# Patient Record
Sex: Female | Born: 1940 | Race: White | Marital: Married | Smoking: Never smoker
Health system: Southern US, Community
[De-identification: ages and names within clinical notes are randomized; demographics above are authoritative.]

## PROBLEM LIST (undated history)

## (undated) DIAGNOSIS — D649 Anemia, unspecified: Secondary | ICD-10-CM

## (undated) DIAGNOSIS — F32A Depression, unspecified: Secondary | ICD-10-CM

## (undated) DIAGNOSIS — R7303 Prediabetes: Secondary | ICD-10-CM

## (undated) DIAGNOSIS — M1990 Unspecified osteoarthritis, unspecified site: Secondary | ICD-10-CM

## (undated) DIAGNOSIS — H3530 Unspecified macular degeneration: Secondary | ICD-10-CM

## (undated) DIAGNOSIS — Z87442 Personal history of urinary calculi: Secondary | ICD-10-CM

## (undated) DIAGNOSIS — I10 Essential (primary) hypertension: Secondary | ICD-10-CM

## (undated) DIAGNOSIS — E7800 Pure hypercholesterolemia, unspecified: Secondary | ICD-10-CM

## (undated) HISTORY — PX: COLONOSCOPY: SHX174

## (undated) HISTORY — PX: APPENDECTOMY: SHX54

## (undated) HISTORY — PX: CATARACT EXTRACTION: SUR2

## (undated) HISTORY — PX: KNEE ARTHROSCOPY: SHX127

## (undated) HISTORY — PX: TONSILLECTOMY: SUR1361

## (undated) HISTORY — PX: CHOLECYSTECTOMY: SHX55

## (undated) HISTORY — PX: WISDOM TOOTH EXTRACTION: SHX21

---

## 2011-05-29 DIAGNOSIS — M5030 Other cervical disc degeneration, unspecified cervical region: Secondary | ICD-10-CM | POA: Insufficient documentation

## 2011-05-29 DIAGNOSIS — M5412 Radiculopathy, cervical region: Secondary | ICD-10-CM | POA: Insufficient documentation

## 2012-11-24 DIAGNOSIS — M7918 Myalgia, other site: Secondary | ICD-10-CM | POA: Insufficient documentation

## 2013-08-26 ENCOUNTER — Other Ambulatory Visit: Payer: Self-pay | Admitting: Orthopaedic Surgery

## 2013-08-26 DIAGNOSIS — M545 Low back pain, unspecified: Secondary | ICD-10-CM

## 2013-09-03 ENCOUNTER — Ambulatory Visit
Admission: RE | Admit: 2013-09-03 | Discharge: 2013-09-03 | Disposition: A | Discharge status: Home / Self Care | Payer: Medicare Other | Source: Ambulatory Visit | Attending: Orthopaedic Surgery | Admitting: Orthopaedic Surgery

## 2013-09-03 DIAGNOSIS — M545 Low back pain, unspecified: Secondary | ICD-10-CM

## 2015-06-21 DIAGNOSIS — I10 Essential (primary) hypertension: Secondary | ICD-10-CM | POA: Insufficient documentation

## 2015-06-21 DIAGNOSIS — E782 Mixed hyperlipidemia: Secondary | ICD-10-CM | POA: Insufficient documentation

## 2015-06-21 DIAGNOSIS — E119 Type 2 diabetes mellitus without complications: Secondary | ICD-10-CM | POA: Insufficient documentation

## 2015-09-06 DIAGNOSIS — M654 Radial styloid tenosynovitis [de Quervain]: Secondary | ICD-10-CM | POA: Insufficient documentation

## 2015-09-06 DIAGNOSIS — M65331 Trigger finger, right middle finger: Secondary | ICD-10-CM | POA: Insufficient documentation

## 2017-05-01 ENCOUNTER — Ambulatory Visit (INDEPENDENT_AMBULATORY_CARE_PROVIDER_SITE_OTHER): Payer: Medicare Other

## 2017-05-01 ENCOUNTER — Ambulatory Visit (INDEPENDENT_AMBULATORY_CARE_PROVIDER_SITE_OTHER): Payer: Medicare Other | Admitting: Orthopaedic Surgery

## 2017-05-01 ENCOUNTER — Encounter (INDEPENDENT_AMBULATORY_CARE_PROVIDER_SITE_OTHER): Payer: Self-pay | Admitting: Orthopaedic Surgery

## 2017-05-01 DIAGNOSIS — M25562 Pain in left knee: Secondary | ICD-10-CM

## 2017-05-01 NOTE — Progress Notes (Signed)
Office Visit Note   Patient: Wendy Vaughn           Date of Birth: Dec 28, 1940           MRN: [id number] Visit Date: 05/01/2017              Requested by: No referring provider defined for this encounter. PCP: Leola BrazilEverly, Rebecca B, DO   Assessment & Plan: Visit Diagnoses:  1. Acute pain of left knee     Plan: Left knee was wrapped with an Ace bandage she will leave this on until this evening and take it off before going to bed.  If her pain does not dissipate in a week she will call the office and let us know so that we can order an MRI to rule out a meniscal tear.  Questions encouraged and answered by Dr. Magnus IvanBlackman and myself.  Follow-Up Instructions: Return if symptoms worsen or fail to improve.   Orders:  Orders Placed This Encounter  Procedures  . XR Knee 1-2 Views Left   No orders of the defined types were placed in this encounter.     Procedures: No procedures performed   Clinical Data: No additional findings.   Subjective: Chief Complaint  Patient presents with  . Left Knee - Pain    HPI Mrs.  Wendy Vaughn comes in today due to acute onset of left knee pain.  She reports that she was simply getting out of her car last night and twisted her leg.  Since then she has had trouble bearing weight on the knee and is gotten the point that she cannot bear weight on the knee cannot walk due to pain knee.  She has no real pain in the knee prior to last night.  She notes some swelling.  Knee feels as if it may give way. Review of Systems Negative outside HPI  Objective: Vital Signs: There were no vitals taken for this visit.  Physical Exam  Constitutional: She is oriented to person, place, and time. She appears well-developed and well-nourished. No distress.  Pulmonary/Chest: Effort normal.  Neurological: She is alert and oriented to person, place, and time.  Skin: She is not diaphoretic.  Psychiatric: She has a normal mood and affect.    Ortho Exam Left knee slight edema  no effusion.  No abnormal warmth no erythema.  No instability valgus varus stressing.  She has full extension flexion to approximately 105 degrees.  Tenderness along medial joint line.  Anterior drawer is negative.  McMurray's positive Specialty Comments:  No specialty comments available.  Imaging: Xr Knee 1-2 Views Left  Result Date: 05/01/2017 Left knee AP lateral views: No acute fracture.  Mild medial joint line narrowing.  Posterior medial joint  with loose body.  Moderate patellofemoral changes.  Lateral joint line is well-maintained.  No subluxation dislocation of the knee.  No bony abnormalities otherwise.    PMFS History: Patient Active Problem List   Diagnosis Date Noted  . De Quervain's tenosynovitis, left 09/06/2015  . Trigger middle finger of right hand 09/06/2015  . Benign essential HTN 06/21/2015  . Mixed hyperlipidemia 06/21/2015  . Type 2 diabetes mellitus without complication (HCC) 06/21/2015  . Myofascial pain 11/24/2012  . Cervical radiculopathy 05/29/2011  . DDD (degenerative disc disease), cervical 05/29/2011   History reviewed. No pertinent past medical history.  History reviewed. No pertinent family history.  History reviewed. No pertinent surgical history. Social History   Occupational History  . Not on file  Tobacco Use  .  Smoking status: Not on file  Substance and Sexual Activity  . Alcohol use: Not on file  . Drug use: Not on file  . Sexual activity: Not on file

## 2017-05-08 ENCOUNTER — Other Ambulatory Visit (INDEPENDENT_AMBULATORY_CARE_PROVIDER_SITE_OTHER): Payer: Self-pay | Admitting: *Deleted

## 2017-05-08 DIAGNOSIS — M25562 Pain in left knee: Principal | ICD-10-CM

## 2017-05-08 DIAGNOSIS — G8929 Other chronic pain: Secondary | ICD-10-CM

## 2017-05-26 ENCOUNTER — Ambulatory Visit
Admission: RE | Admit: 2017-05-26 | Discharge: 2017-05-26 | Disposition: A | Discharge status: Home / Self Care | Payer: Medicare Other | Source: Ambulatory Visit | Attending: Orthopaedic Surgery | Admitting: Orthopaedic Surgery

## 2017-05-26 DIAGNOSIS — M25562 Pain in left knee: Principal | ICD-10-CM

## 2017-05-26 DIAGNOSIS — G8929 Other chronic pain: Secondary | ICD-10-CM

## 2017-06-02 ENCOUNTER — Encounter (INDEPENDENT_AMBULATORY_CARE_PROVIDER_SITE_OTHER): Payer: Self-pay | Admitting: Orthopaedic Surgery

## 2017-06-02 ENCOUNTER — Ambulatory Visit (INDEPENDENT_AMBULATORY_CARE_PROVIDER_SITE_OTHER): Payer: Medicare Other | Admitting: Orthopaedic Surgery

## 2017-06-02 DIAGNOSIS — S83242D Other tear of medial meniscus, current injury, left knee, subsequent encounter: Secondary | ICD-10-CM | POA: Diagnosis not present

## 2017-06-02 NOTE — Progress Notes (Signed)
The patient is here to go over an MRI of her left knee.  She has had only mild aches and pains in that knee until an acute issue last month.  She had a twisting injury getting out of her car at church and had severe pain since then.  She still feels the knee is locking catching on her and she is uncertain of that knee in terms of being scared is not to support her and she is in a fall again.  We will send her for an MRI of the very conservative treatment including injection and quad strengthening exercises as well as rest, anti-inflammatories, and time.  On exam she is positive McMurray on the medial and lateral sides of her knee with mild effusion and good range of motion.  MRI does show complex tears of the posterior horn of the medial and lateral meniscus with only partial cartilage loss in the medial lateral compartments of the knee.  There is more extensive cartilage loss of the patella apex but the ACL PCL and collateral ligaments are all intact.  Given the extensive meniscal tearing in her knee were recommending an arthroscopic intervention.  This is also due to the continued mechanical symptoms of locking catching and the lack of trust she has in her knee.  I showed her knee model and went over in detail with the surgery involves including a thorough discussion of the risk and benefits of the surgery.  She does wish to have this scheduled soon and we will see her back in 1 week postoperative for suture removal.  We then work on strengthening her knee after that.  All questions concerns were answered and addressed.

## 2017-06-12 ENCOUNTER — Encounter: Payer: Self-pay | Admitting: Orthopaedic Surgery

## 2017-06-12 DIAGNOSIS — M23252 Derangement of posterior horn of lateral meniscus due to old tear or injury, left knee: Secondary | ICD-10-CM | POA: Diagnosis not present

## 2017-06-23 ENCOUNTER — Encounter (INDEPENDENT_AMBULATORY_CARE_PROVIDER_SITE_OTHER): Payer: Self-pay | Admitting: Orthopaedic Surgery

## 2017-06-23 ENCOUNTER — Ambulatory Visit (INDEPENDENT_AMBULATORY_CARE_PROVIDER_SITE_OTHER): Payer: Medicare Other | Admitting: Orthopaedic Surgery

## 2017-06-23 DIAGNOSIS — Z9889 Other specified postprocedural states: Secondary | ICD-10-CM

## 2017-06-23 DIAGNOSIS — M1712 Unilateral primary osteoarthritis, left knee: Secondary | ICD-10-CM

## 2017-06-23 MED ORDER — LIDOCAINE HCL 1 % IJ SOLN
3.0000 mL | INTRAMUSCULAR | Status: AC | PRN
  Administered 2017-06-23: 3 mL

## 2017-06-23 MED ORDER — METHYLPREDNISOLONE ACETATE 40 MG/ML IJ SUSP
40.0000 mg | INTRAMUSCULAR | Status: AC | PRN
  Administered 2017-06-23: 40 mg via INTRA_ARTICULAR

## 2017-06-23 NOTE — Progress Notes (Signed)
Office Visit Note   Patient: Wendy Vaughn           Date of Birth: [birth year]           MRN: [id number] Visit Date: 06/23/2017              Requested by: Leola BrazilEverly, Rebecca B, DO [address] [place], [place] [id number] PCP: Leola BrazilEverly, Rebecca B, DO   Assessment & Plan: Visit Diagnoses:  1. Status post arthroscopy of left knee     Plan: The patient is a perfect candidate for hyaluronic acid in her left knee given the moderate arthritic findings.  I placed a steroid injection in her knee today and we will see her back in a month to place hyaluronic acid injection into her left knee.  At that same visit though I would like to provide trigger point injections around her right scapula and trapezius area due to chronic trigger point pain.  All questions and concerns were answered and addressed.  Follow-Up Instructions: Return in about 4 weeks (around 07/21/2017).   Orders:  No orders of the defined types were placed in this encounter.  No orders of the defined types were placed in this encounter.     Procedures: Large Joint Inj: L knee on 06/23/2017 4:01 PM Indications: diagnostic evaluation and pain Details: 22 G 1.5 in needle, superolateral approach  Arthrogram: No  Medications: 3 mL lidocaine 1 %; 40 mg methylPREDNISolone acetate 40 MG/ML Outcome: tolerated well, no immediate complications Procedure, treatment alternatives, risks and benefits explained, specific risks discussed. Consent was given by the patient. Immediately prior to procedure a time out was called to verify the correct patient, procedure, equipment, support staff and site/side marked as required. Patient was prepped and draped in the usual sterile fashion.       Clinical Data: No additional findings.   Subjective: Chief Complaint  Patient presents with  . Left Knee - Routine Post Op  The patient is well-known to me.  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  This is moderate arthritis.  She would like a steroid injection in the knee today and for me to try to get fluid off of her knee today.  We talked about hyaluronic acid in the future.  She does have a history of severe trigger point type pain along the right trapezius and scapular body.  This been a chronic problem for her especially with laying down.  She denies any headache, chest pain, shortness of breath, fever, chills, nausea, vomiting.  HPI  Review of Systems See above  Objective: Vital Signs: There were no vitals taken for this visit.  Physical Exam She is alert and oriented x3 no acute distress Ortho Exam Her left knee is examined postoperatively and shows what I feel is a mild effusion but I cannot do any fluid off of her knee.  Her incisions are well-healed with sutures removed.  She has good range of motion of that knee and pain along the medial joint line to be expected given the extent of her surgery.  She does have severe trigger point pain around the right trapezius muscle that extends around the right scapular winging.  There is no scapular winging though and no muscle atrophy or weakness.  This  appears to be trigger point pain Specialty Comments:  No specialty comments available.  Imaging: No results found. We were able to go over arthroscopy pictures as well.  PMFS History: Patient Active Problem List   Diagnosis Date Noted  . Status post arthroscopy of left knee 06/23/2017  . Other tear of medial meniscus, current injury, left knee, subsequent encounter 06/02/2017  . De Quervain's tenosynovitis, left 09/06/2015  . Trigger middle finger of right hand 09/06/2015  . Benign essential HTN 06/21/2015  . Mixed hyperlipidemia 06/21/2015  . Type 2 diabetes mellitus without complication (HCC)  06/21/2015  . Myofascial pain 11/24/2012  . Cervical radiculopathy 05/29/2011  . DDD (degenerative disc disease), cervical 05/29/2011   History reviewed. No pertinent past medical history.  History reviewed. No pertinent family history.  History reviewed. No pertinent surgical history. Social History   Occupational History  . Not on file  Tobacco Use  . Smoking status: Not on file  Substance and Sexual Activity  . Alcohol use: Not on file  . Drug use: Not on file  . Sexual activity: Not on file

## 2017-06-24 ENCOUNTER — Telehealth (INDEPENDENT_AMBULATORY_CARE_PROVIDER_SITE_OTHER): Payer: Self-pay

## 2017-06-24 NOTE — Telephone Encounter (Signed)
Submitted benefit verification for Monovisc Injection online.

## 2017-06-25 ENCOUNTER — Telehealth (INDEPENDENT_AMBULATORY_CARE_PROVIDER_SITE_OTHER): Payer: Self-pay

## 2017-06-25 NOTE — Telephone Encounter (Signed)
Talked with patient and advised her that she is covered for left knee injection for Monovisc and we can Buy & Bill for Injection.  Patient's secondary, [employer] supplement will cover patient's 20%.  Appt.scheduled for 07/21/2017.

## 2017-07-21 ENCOUNTER — Ambulatory Visit (INDEPENDENT_AMBULATORY_CARE_PROVIDER_SITE_OTHER): Payer: Medicare Other | Admitting: Orthopaedic Surgery

## 2017-07-21 ENCOUNTER — Encounter (INDEPENDENT_AMBULATORY_CARE_PROVIDER_SITE_OTHER): Payer: Self-pay | Admitting: Orthopaedic Surgery

## 2017-07-21 DIAGNOSIS — Z9889 Other specified postprocedural states: Secondary | ICD-10-CM

## 2017-07-21 DIAGNOSIS — G8929 Other chronic pain: Secondary | ICD-10-CM

## 2017-07-21 DIAGNOSIS — M1712 Unilateral primary osteoarthritis, left knee: Secondary | ICD-10-CM | POA: Diagnosis not present

## 2017-07-21 DIAGNOSIS — M25562 Pain in left knee: Secondary | ICD-10-CM

## 2017-07-21 MED ORDER — HYALURONAN 88 MG/4ML IX SOSY
88.0000 mg | PREFILLED_SYRINGE | INTRA_ARTICULAR | Status: AC | PRN
  Administered 2017-07-21: 88 mg via INTRA_ARTICULAR

## 2017-07-21 NOTE — Progress Notes (Signed)
   Procedure Note  Patient: Wendy Vaughn             Date of Birth: [birth year]           MRN: [id number]             Visit Date: 07/21/2017  Procedures: Visit Diagnoses: Status post arthroscopy of left knee  Chronic pain of left knee  Unilateral primary osteoarthritis, left knee  Large Joint Inj: L knee on 07/21/2017 1:46 PM Indications: pain and diagnostic evaluation Details: 22 G 1.5 in needle, superolateral approach  Arthrogram: No  Medications: 88 mg Hyaluronan 88 MG/4ML Outcome: tolerated well, no immediate complications Procedure, treatment alternatives, risks and benefits explained, specific risks discussed. Consent was given by the patient. Immediately prior to procedure a time out was called to verify the correct patient, procedure, equipment, support staff and site/side marked as required. Patient was prepped and draped in the usual sterile fashion.     The patient is here today for scheduled hyaluronic acid injection in her left knee using a Monovisc to treat moderate osteoarthritis and the pain associated with this.  She is doing well overall from her knee arthroscopy we did find some areas of moderate arthritis and it is believed that this is the best option for her now with trying hyaluronic acid.  The risks and benefits of these type injections were explained to her in detail and she said a handout that she read about it and would like to proceed with injection today.  On exam there is no significant swelling in her knee.  She tolerated the injection well.  All questions concerns were answered and addressed.  She will follow-up as needed.

## 2018-03-29 IMAGING — MR MR KNEE*L* W/O CM
6 series · 34 of 40 positions shown · non-contrast
Comparison: None.

CLINICAL DATA: Chronic left knee pain.

EXAM:
MRI OF THE LEFT KNEE WITHOUT CONTRAST
TECHNIQUE: Multiplanar, multisequence MR imaging of the knee was performed. No
intravenous contrast was administered.

[Series 6: PD fat-sat · axial · left · 3.5mm · 0.39mm/px · z∈[-65,+48]mm · 8 of 28 slices shown (1 of 3)]
[im 1/28]
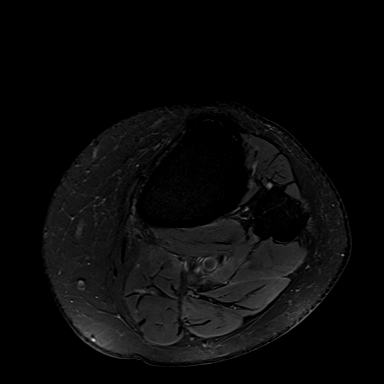
[im 4/28]
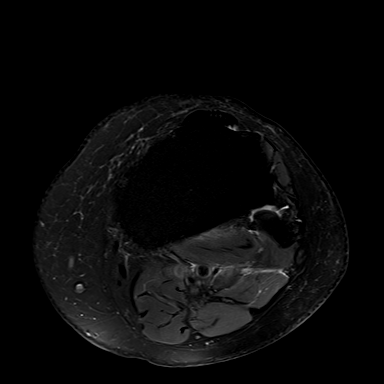
[im 8/28]
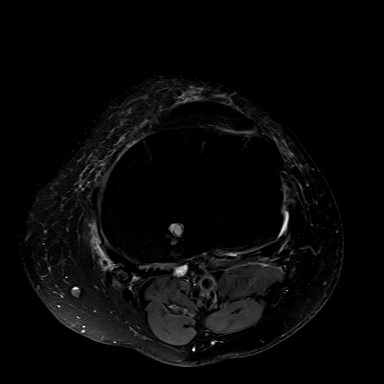
[im 12/28]
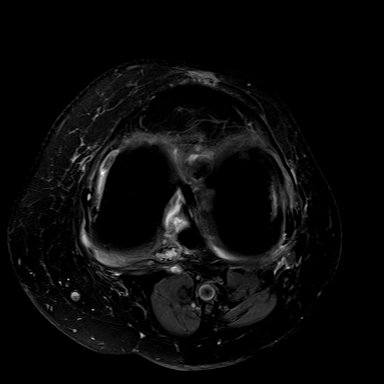
[im 16/28]
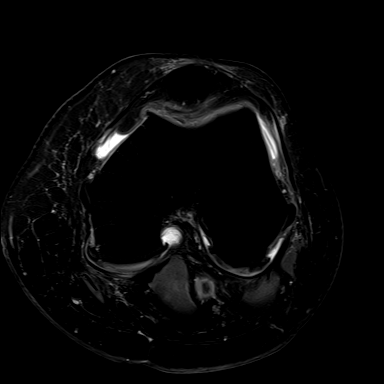
[im 20/28]
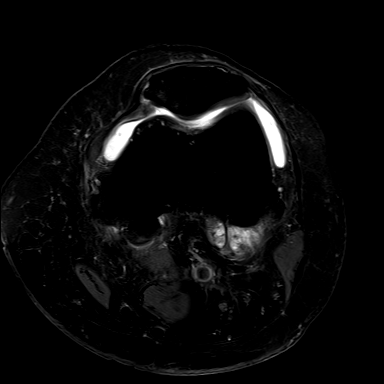
[im 24/28]
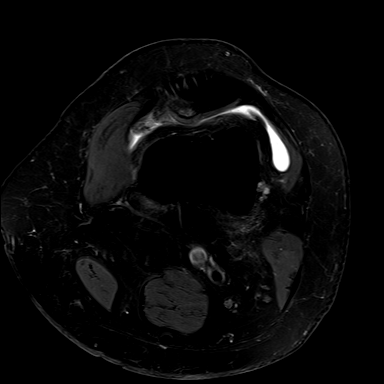
[im 28/28]
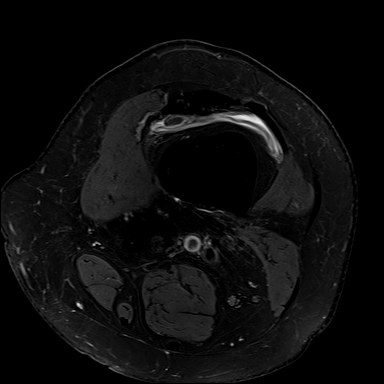

[Series 7: PD fat-sat · coronal · left · 3.5mm · 0.39mm/px · 7 of 24 slices shown (2 of 3)]
[im 1/24]
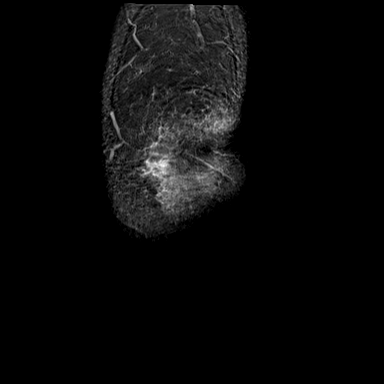
[im 4/24]
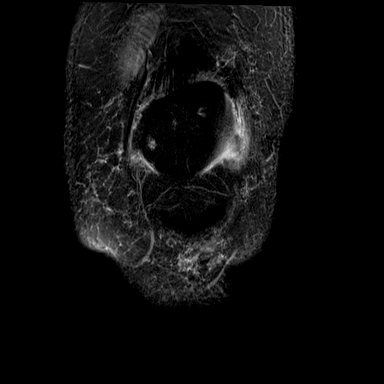
[im 8/24]
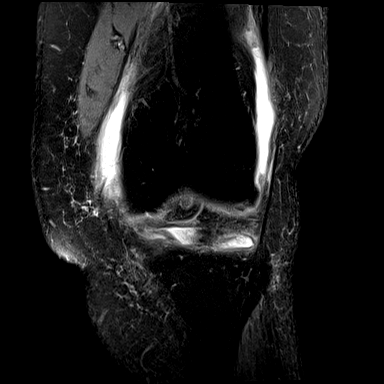
[im 12/24]
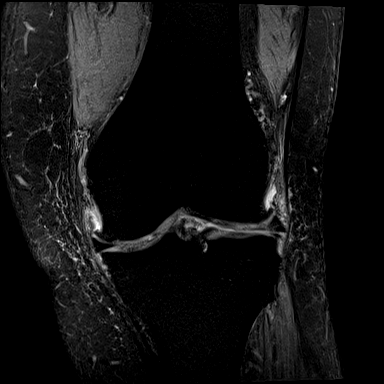
[im 16/24]
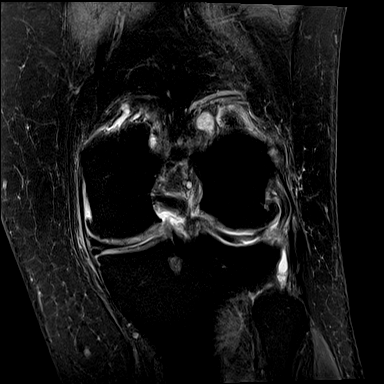
[im 20/24]
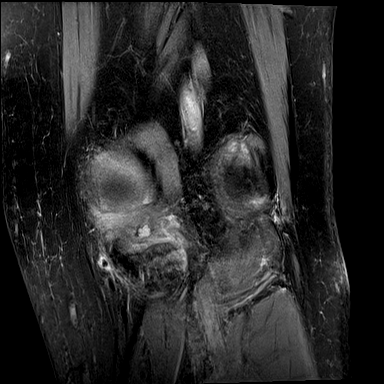
[im 24/24]
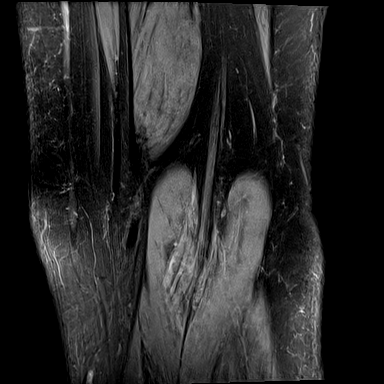

[Series 8: t1_tse_cor · coronal · left · 3.5mm · 0.39mm/px · 1 of 24 slices shown]
[im 1/24]
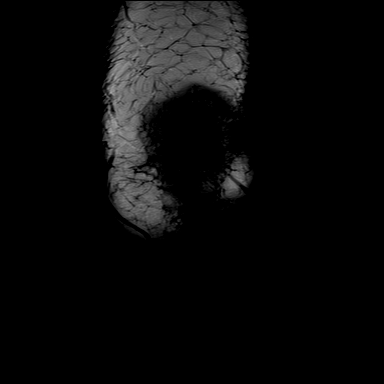

[Series 9: PD fat-sat · sagittal · left · 3.3mm · 0.39mm/px · 8 of 26 slices shown (3 of 3)]
[im 1/26]
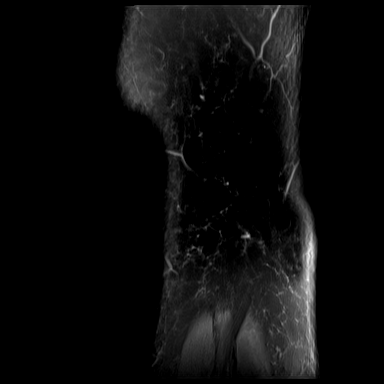
[im 4/26]
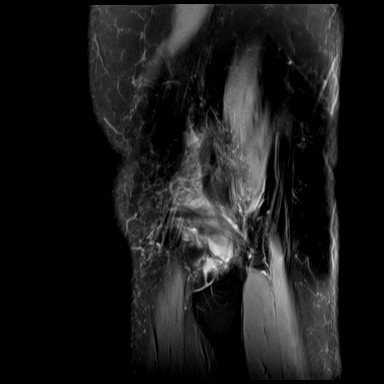
[im 8/26]
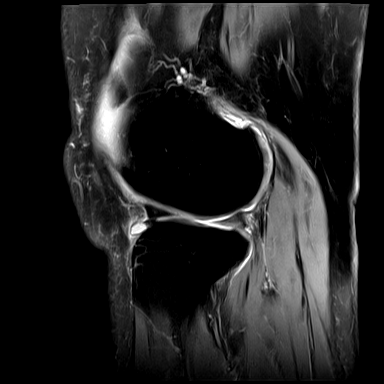
[im 11/26]
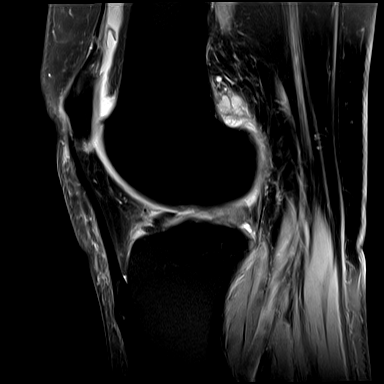
[im 15/26]
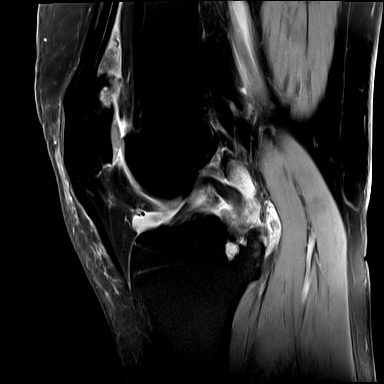
[im 18/26]
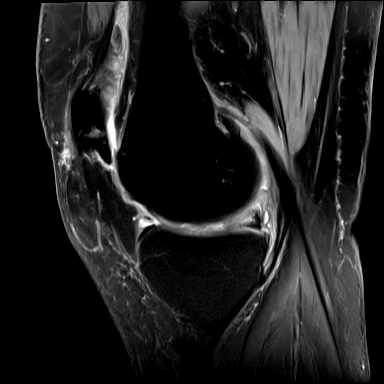
[im 22/26]
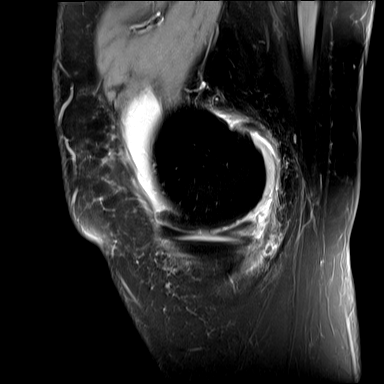
[im 26/26]
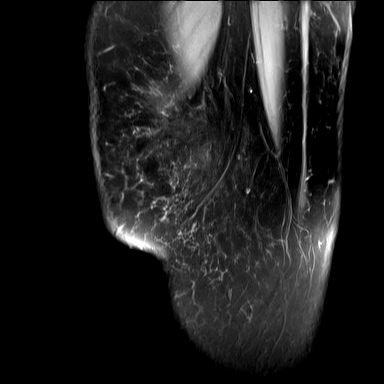

[Series 10: T2 fat-sat · coronal · left · 3.5mm · 0.39mm/px · 7 of 24 slices shown]
[im 1/24]
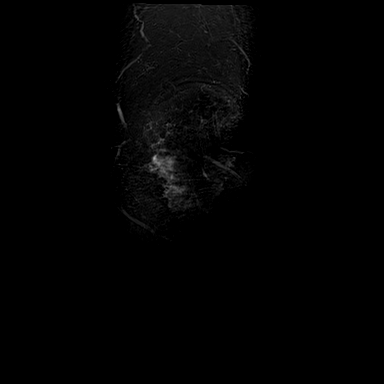
[im 4/24]
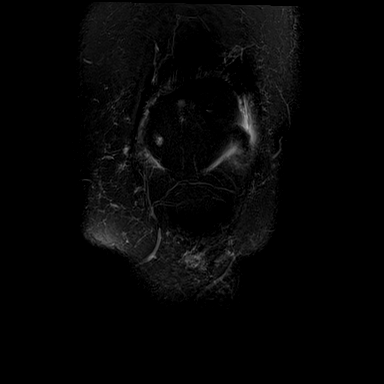
[im 8/24]
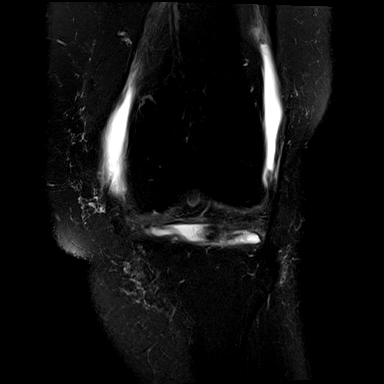
[im 12/24]
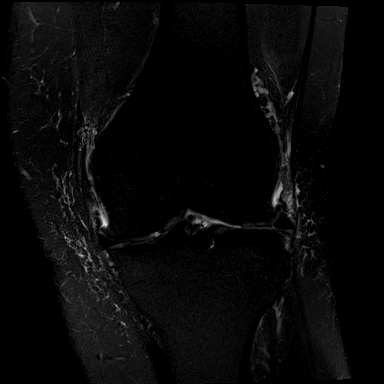
[im 16/24]
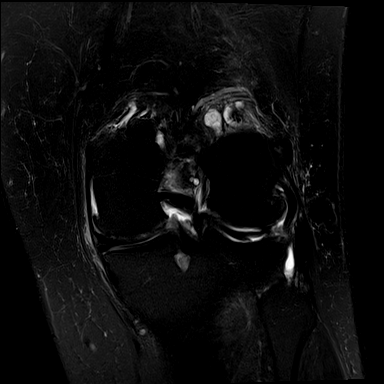
[im 20/24]
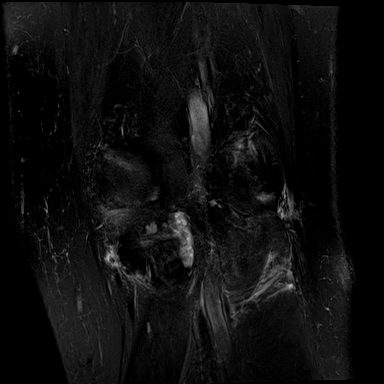
[im 24/24]
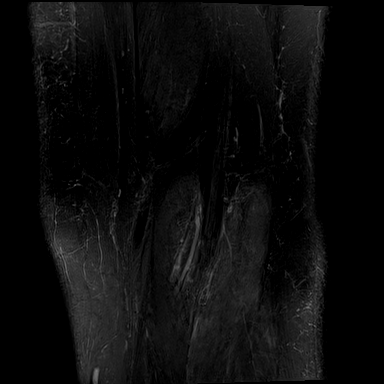

[Series 11: PD · coronal · left · 2.5mm · 0.44mm/px · 3 of 12 slices shown]
[im 1/12]
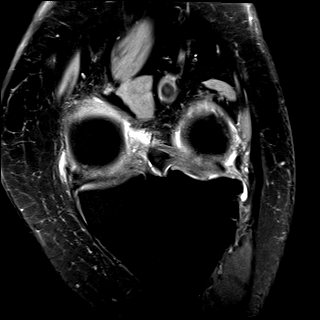
[im 6/12]
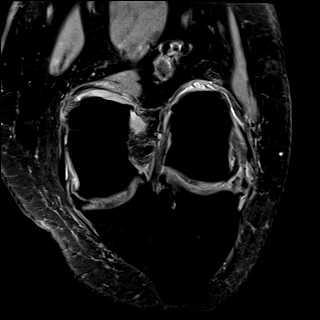
[im 12/12]
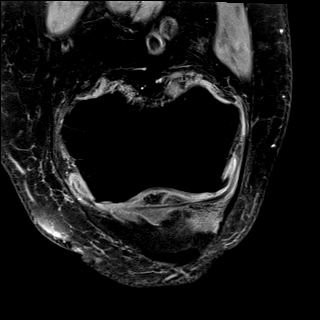

[34 of 40 positions shown; findings below may reference images not displayed]

FINDINGS: MENISCI

Medial meniscus: There is an extensive horizontal tear of the
posterior horn with extension to the periphery with parameniscal
cyst formation at the posteromedial corner.

Lateral meniscus: There is severe degeneration of posterior horn
with a complex tear at the posterolateral corner.

LIGAMENTS

Cruciates:  Intact.

Collaterals:  Normal.

CARTILAGE

Patellofemoral: Focal areas of full and partial thickness cartilage
loss of the apex and medial facet of the patella.

Medial: Multiple areas of partial-thickness cartilage loss on the
femoral condyle with some undermining of the articular cartilage in
the central and posterior aspects of the femoral condyle.

Lateral: Partial-thickness cartilage loss of the posterior aspects
of the femoral condyle and tibial plateau.

Joint: Small joint effusion. Synovial hypertrophy. Ossified
irregular loose body in Hoffa's fat pad in the midline. 12 mm loose
body at the posteromedial corner adjacent to the semimembranosus
tendon.

Popliteal Fossa:  No Baker's cyst.  Popliteus tendon is intact.

Extensor Mechanism:  Normal.

Bones:  Tricompartmental marginal osteophytes.

Other: None
IMPRESSION: 1. Extensive horizontal tear of the posterior horn of the medial
meniscus with parameniscal cysts.
2. Complex tear of the posterior horn of the lateral meniscus.
3. Small joint effusion with synovial hypertrophy and loose bodies
in the joint.
4. Tricompartmental cartilage defects as described.

## 2019-03-22 ENCOUNTER — Encounter: Payer: Self-pay | Admitting: Orthopaedic Surgery

## 2019-03-22 ENCOUNTER — Ambulatory Visit (INDEPENDENT_AMBULATORY_CARE_PROVIDER_SITE_OTHER): Payer: Medicare Other

## 2019-03-22 ENCOUNTER — Other Ambulatory Visit: Payer: Self-pay

## 2019-03-22 ENCOUNTER — Ambulatory Visit (INDEPENDENT_AMBULATORY_CARE_PROVIDER_SITE_OTHER): Payer: Medicare Other | Admitting: Orthopaedic Surgery

## 2019-03-22 DIAGNOSIS — M25562 Pain in left knee: Secondary | ICD-10-CM

## 2019-03-22 DIAGNOSIS — M1712 Unilateral primary osteoarthritis, left knee: Secondary | ICD-10-CM | POA: Diagnosis not present

## 2019-03-22 DIAGNOSIS — G8929 Other chronic pain: Secondary | ICD-10-CM | POA: Diagnosis not present

## 2019-03-22 MED ORDER — LIDOCAINE HCL 1 % IJ SOLN
3.0000 mL | INTRAMUSCULAR | Status: AC | PRN
  Administered 2019-03-22: 3 mL

## 2019-03-22 MED ORDER — METHYLPREDNISOLONE ACETATE 40 MG/ML IJ SUSP
40.0000 mg | INTRAMUSCULAR | Status: AC | PRN
  Administered 2019-03-22: 17:00:00 40 mg via INTRA_ARTICULAR

## 2019-03-22 NOTE — Progress Notes (Signed)
Office Visit Note   Patient: Wendy Vaughn           Date of Birth: 04/07/1941           MRN: [id number] Visit Date: 03/22/2019              Requested by: Leola Brazil, DO [address] [place],  [place] [address] PCP: Leola Brazil, DO   Assessment & Plan: Visit Diagnoses:  1. Acute pain of left knee   2. Chronic pain of left knee   3. Unilateral primary osteoarthritis, left knee     Plan: I was able to aspirate about 20 cc of serous fluid off of the knee it looks more consistent with arthritis.  I placed a steroid injection in her knee and gave her relief of her pain.  She is ambulating with a slight limp.  She knows to try cane in her opposite hand.  All question concerns were answered addressed.  Follow-up will be as needed.  Follow-Up Instructions: Return if symptoms worsen or fail to improve.   Orders:  Orders Placed This Encounter  Procedures  . Large Joint Inj  . XR KNEE 3 VIEW LEFT   No orders of the defined types were placed in this encounter.     Procedures: Large Joint Inj: L knee on 03/22/2019 5:21 PM Indications: diagnostic evaluation and pain Details: 22 G 1.5 in needle, superolateral approach  Arthrogram: No  Medications: 3 mL lidocaine 1 %; 40 mg methylPREDNISolone acetate 40 MG/ML Outcome: tolerated well, no immediate complications Procedure, treatment alternatives, risks and benefits explained, specific risks discussed. Consent was given by the patient. Immediately prior to procedure a time out was called to verify the correct patient, procedure, equipment, support staff and site/side marked as required. Patient was prepped and draped in the usual sterile fashion.       Clinical Data: No additional findings.   Subjective: Chief Complaint  Patient presents with  . Left Knee - Pain  The patient comes in today with acute on chronic left knee pain.  She has known and well-documented osteoarthritis of her left knee.  She has  had steroid injections and hyaluronic acid in the past.  She had a recent mechanical fall and she feels like the left knee is really painful to her.  She feels like she may have "ripped something".  She has had swelling and pain since then.  She denies any other acute changes in her medical status.  HPI  Review of Systems She currently denies any headache, chest pain, shortness of breath, fever, chills, nausea, vomiting  Objective: Vital Signs: There were no vitals taken for this visit.  Physical Exam She is alert and orient x3 and in no acute distress Ortho Exam Examination of her left knee shows a mild effusion.  She has a painful arc of motion of the left knee but is ligamentously stable.  There is medial lateral joint line tenderness and a slight flexion contracture. Specialty Comments:  No specialty comments available.  Imaging: Xr Knee 3 View Left  Result Date: 03/22/2019 2 views of the left knee show no acute findings.  There is moderate arthritis in the knee with joint space narrowing and periarticular osteophytes.  There is no malalignment.    PMFS History: Patient Active Problem List   Diagnosis Date Noted  . Unilateral primary osteoarthritis, left knee 07/21/2017  . Chronic pain of left knee 07/21/2017  . Status post arthroscopy of  left knee 06/23/2017  . Other tear of medial meniscus, current injury, left knee, subsequent encounter 06/02/2017  . De Quervain's tenosynovitis, left 09/06/2015  . Trigger middle finger of right hand 09/06/2015  . Benign essential HTN 06/21/2015  . Mixed hyperlipidemia 06/21/2015  . Type 2 diabetes mellitus without complication ([facility]) 25/42/7062  . Myofascial pain 11/24/2012  . Cervical radiculopathy 05/29/2011  . DDD (degenerative disc disease), cervical 05/29/2011   History reviewed. No pertinent past medical history.  History reviewed. No pertinent family history.  History reviewed. No pertinent surgical history. Social History    Occupational History  . Not on file  Tobacco Use  . Smoking status: Not on file  Substance and Sexual Activity  . Alcohol use: Not on file  . Drug use: Not on file  . Sexual activity: Not on file

## 2019-03-24 ENCOUNTER — Ambulatory Visit: Payer: Medicare Other | Admitting: Orthopaedic Surgery

## 2019-07-08 ENCOUNTER — Ambulatory Visit (INDEPENDENT_AMBULATORY_CARE_PROVIDER_SITE_OTHER): Payer: Medicare Other

## 2019-07-08 ENCOUNTER — Other Ambulatory Visit: Payer: Self-pay

## 2019-07-08 ENCOUNTER — Encounter: Payer: Self-pay | Admitting: Orthopaedic Surgery

## 2019-07-08 ENCOUNTER — Ambulatory Visit (INDEPENDENT_AMBULATORY_CARE_PROVIDER_SITE_OTHER): Payer: Medicare Other | Admitting: Orthopaedic Surgery

## 2019-07-08 DIAGNOSIS — M549 Dorsalgia, unspecified: Secondary | ICD-10-CM | POA: Diagnosis not present

## 2019-07-08 NOTE — Progress Notes (Signed)
Office Visit Note   Patient: Wendy Vaughn           Date of Birth: [birth year]           MRN: [id number] Visit Date: 07/08/2019              Requested by: Leola Brazil, DO [address] [place],  [place] [address] PCP: Leola Brazil, DO   Assessment & Plan: Visit Diagnoses:  1. Mid back pain     Plan: I would like to send her to formal outpatient physical therapy to have outpatient therapy work on knee modalities to calm down her parascapular and thoracic pain.  I do feel that this is something that they can try modalities such as e-stim and diaphoresis and even consider dry needling.  She agrees with this treatment plan.  Also have recommended that she try Voltaren gel in this area 2-4 times a day.  I gave her information about getting this as an outpatient.  All question concerns were answered and addressed.  We will work on setting up outpatient therapy.  I would like to see her back in about 4 weeks to see how she is doing overall.  No x-rays are needed.  I may even consider a trigger point injection then if needed.  Follow-Up Instructions: Return in about 4 weeks (around 08/05/2019).   Orders:  Orders Placed This Encounter  Procedures  . XR Thoracic Spine 2 View   No orders of the defined types were placed in this encounter.     Procedures: No procedures performed   Clinical Data: No additional findings.   Subjective: Chief Complaint  Patient presents with  . Middle Back - Pain  Wendy Vaughn has been dealing with thoracic spine pain around the middle of her back into the left side for about 30 years now.  She is very active.  Usually she would lay down and it would go away.  She does a lot of yard work as well.  She is very active 78.  It is now gone where the pain is becoming debilitating for her.  She denies any weakness in her upper or lower extremities and it is the same pain that she has had before.  She does go to massage therapy and that has not  helped.  She is not had any formal physical therapy.  She denies any other acute change in her medical status.  She is not a smoker  HPI  Review of Systems .  She currently denies any headache, chest pain, shortness of breath, fever, chills, nausea, vomiting  Objective: Vital Signs: There were no vitals taken for this visit.  Physical Exam She is alert and orient x3 and in no acute distress Ortho Exam Examination of her thoracic spine shows good flexion extension of the spine.  She does have paraspinal and parascapular pain to the left side at the mid thoracic spine area.  The remainder of her exam is normal. Specialty Comments:  No specialty comments available.  Imaging: XR Thoracic Spine 2 View  Result Date: 07/08/2019 2 views of the thoracic spine showed no acute findings.  There is degenerative changes every level but no compression deformities.    PMFS History: Patient Active Problem List   Diagnosis Date Noted  . Unilateral primary osteoarthritis, left knee 07/21/2017  . Chronic pain of left knee 07/21/2017  . Status post arthroscopy of left knee 06/23/2017  . Other tear of medial meniscus, current  injury, left knee, subsequent encounter 06/02/2017  . De Quervain's tenosynovitis, left 09/06/2015  . Trigger middle finger of right hand 09/06/2015  . Benign essential HTN 06/21/2015  . Mixed hyperlipidemia 06/21/2015  . Type 2 diabetes mellitus without complication ([facility]) 09/38/1829  . Myofascial pain 11/24/2012  . Cervical radiculopathy 05/29/2011  . DDD (degenerative disc disease), cervical 05/29/2011   History reviewed. No pertinent past medical history.  History reviewed. No pertinent family history.  History reviewed. No pertinent surgical history. Social History   Occupational History  . Not on file  Tobacco Use  . Smoking status: Not on file  Substance and Sexual Activity  . Alcohol use: Not on file  . Drug use: Not on file  . Sexual activity: Not on file

## 2019-07-13 ENCOUNTER — Other Ambulatory Visit: Payer: Self-pay

## 2019-07-13 ENCOUNTER — Encounter: Payer: Self-pay | Admitting: Physical Therapy

## 2019-07-13 ENCOUNTER — Ambulatory Visit: Payer: Medicare Other | Attending: Orthopaedic Surgery | Admitting: Physical Therapy

## 2019-07-13 DIAGNOSIS — M62838 Other muscle spasm: Secondary | ICD-10-CM | POA: Diagnosis present

## 2019-07-13 DIAGNOSIS — M546 Pain in thoracic spine: Secondary | ICD-10-CM

## 2019-07-13 DIAGNOSIS — R293 Abnormal posture: Secondary | ICD-10-CM | POA: Diagnosis present

## 2019-07-13 NOTE — Therapy (Signed)
[facility] Outpatient Rehabilitation [facility] [address]  [address] [place], [place], [address] Phone: [phone]   Fax:  [phone]  Physical Therapy Evaluation  Patient Details  Name: Wendy Vaughn MRN: [id number] Date of Birth: Mar 23, 1941 Referring Provider (PT): Doneen Poisson, MD   Encounter Date: 07/13/2019  PT End of Session - 07/13/19 1525    Visit Number  1    Number of Visits  7    Date for PT Re-Evaluation  08/24/19    Authorization Type  Medicare & AARP    PT Start Time  1442    PT Stop Time  1517    PT Time Calculation (min)  35 min    Activity Tolerance  Patient tolerated treatment well    Behavior During Therapy  [facility] for tasks assessed/performed       History reviewed. No pertinent past medical history.  History reviewed. No pertinent surgical history.  There were no vitals filed for this visit.   Subjective Assessment - 07/13/19 1446    Subjective  Patient reporting L midback pain of about 30 years duration. Was made worse by working in the kitchen, ironing, or working in the yard and was relieved by sitting or laying down to rest. Previously this pain would go away within a couple hours, but recently it is taking up to 2 days to gradually go away. Pain is located over L side of torso. Denies N/T.    Pertinent History  HTN, cervical radiculopathy, cervical DDD, L de quervain's, HLD, myofascial pain, DM II, L knee arthroscopythoracic FOTO!!L thoracic pain better with laying down modalities2/25/2021 thoracic xray: no acute findings.  There is degenerative changes every level but no compression deformities.    Limitations  Lifting;Standing;Walking;Writing;House hold activities    Diagnostic tests  07/08/2019 thoracic xray: no acute findings.  There is degenerative changes every level but no compression deformities.    Patient Stated Goals  "to work in the yard without pain"    Currently in Pain?  Yes    Pain Score  0-No pain    Pain  Location  Thoracic    Pain Orientation  Left;Mid    Pain Descriptors / Indicators  Throbbing    Pain Type  Chronic pain         [facility] PT Assessment - 07/13/19 1451      Assessment   Medical Diagnosis  Midback pain    Referring Provider (PT)  Doneen Poisson, MD    Onset Date/Surgical Date  --   30 years   Hand Dominance  Right    Next MD Visit  08/05/19    Prior Therapy  no      Precautions   Precautions  None      Balance Screen   Has the patient fallen in the past 6 months  Yes    How many times?  1   slide down a hill and hurt her L knee   Has the patient had a decrease in activity level because of a fear of falling?   No    Is the patient reluctant to leave their home because of a fear of falling?   No      Home Environment   Living Environment  Private residence    Living Arrangements  Alone    Available Help at Discharge  Family    Type of Home  House      Prior Function   Level of Independence  Independent  Vocation  Retired    Leisure  working in the yard      Cognition   Overall Cognitive Status  Within [employer] for tasks assessed      Sensation   Light Touch  Appears Intact      Coordination   Gross Motor Movements are Fluid and Coordinated  Yes      Posture/Postural Control   Posture/Postural Control  Postural limitations    Postural Limitations  Rounded Shoulders;Forward head;Increased thoracic kyphosis      ROM / Strength   AROM / PROM / Strength  AROM;Strength      AROM   Overall AROM Comments  B shoulder AROM WFL and nonpainful    AROM Assessment Site  Thoracic    Thoracic Flexion  WFL    Thoracic Extension  moderately limited   c/o stiffness   Thoracic - Right Side Bend  mildly limited    Thoracic - Left Side Bend  mildly limited    Thoracic - Right Rotation  mildly limited    Thoracic - Left Rotation  mildly limited      Strength   Strength Assessment Site  Shoulder    Right/Left Shoulder  Right;Left    Right  Shoulder Flexion  4+/5    Right Shoulder ABduction  4+/5    Right Shoulder Internal Rotation  4+/5    Right Shoulder External Rotation  4/5    Left Shoulder Flexion  4+/5    Left Shoulder ABduction  4+/5    Left Shoulder Internal Rotation  4+/5    Left Shoulder External Rotation  4/5      Palpation   Spinal mobility  TTP with gentle PAs over upper thoracic spine    Palpation comment  TTP over L proximal lats near axilla, thoracic paraspinals, rhomboids      Ambulation/Gait   Assistive device  None    Gait Pattern  Step-through pattern;Step-to pattern;Decreased step length - right;Decreased step length - left;Trunk flexed;Poor foot clearance - left;Poor foot clearance - right    Ambulation Surface  Level;Indoor    Gait velocity  decreased                Objective measurements completed on examination: See above findings.              PT Education - 07/13/19 1525    Education Details  prognosis, POC, HEP    Person(s) Educated  Patient    Methods  Explanation;Demonstration;Verbal cues;Tactile cues;Handout    Comprehension  Returned demonstration;Verbalized understanding       PT Short Term Goals - 07/13/19 1534      PT SHORT TERM GOAL #1   Title  Patient to be independent with initial HEP.    Time  3    Period  Weeks    Status  New    Target Date  08/03/19        PT Long Term Goals - 07/13/19 1535      PT LONG TERM GOAL #1   Title  Patient to be independent with advanced HEP.    Time  6    Period  Weeks    Status  New    Target Date  08/24/19      PT LONG TERM GOAL #2   Title  Patient to demonstrate thoracic AROM WFL and without c/o stiffness.    Time  6    Period  Weeks    Status  New  Target Date  08/24/19      PT LONG TERM GOAL #3   Title  Patient to demonstrate and recall proper postural correction techniques at rest and with activities.    Time  6    Period  Weeks    Status  New    Target Date  08/24/19      PT LONG TERM GOAL  #4   Title  Patient to report tolerance for 1.5 hours working in the garden without c/o pain.    Time  6    Period  Weeks    Status  New    Target Date  08/24/19             Plan - 07/13/19 1525    Clinical Impression Statement  Patient is a 78y/o F presenting to OPPT with c/o chronic L thoracic pain of 30 years duration. Denies N/T. Made worse by cooking, ironing, or working in the yard. Better with rest, however patient notes that it is recently taking longer for the pain to go away. Patient today presented with limited thoracic AROM, decreased shoulder ER strength, rounded shoulders, increased thoracic kyphosis, and forward head posture, tenderness with central PAs over upper thoracic spine, and TTP over L L proximal lats, thoracic paraspinals, and rhomboids. Patient educated on gentle stretching HEP- patient reported understanding. Would benefit from skilled PT services 1x/week for 6 weeks to address aforementioned impairments.    Personal Factors and Comorbidities  Age;Comorbidity 3+;Fitness;Past/Current Experience;Time since onset of injury/illness/exacerbation    Comorbidities  HTN, cervical radiculopathy, cervical DDD, L de quervain's, HLD, myofascial pain, DM II, L knee arthroscopy    Examination-Activity Limitations  Carry;Stand;Lift;Reach Overhead    Examination-Participation Restrictions  Church;Cleaning;Shop;Community Activity;Yard Work;Laundry;Meal Prep    Stability/Clinical Decision Making  Stable/Uncomplicated    Clinical Decision Making  Low    Rehab Potential  Good    PT Frequency  1x / week    PT Duration  6 weeks    PT Treatment/Interventions  ADLs/Self Care Home Management;Cryotherapy;Electrical Stimulation;Iontophoresis 4mg /ml Dexamethasone;Moist Heat;Therapeutic exercise;Therapeutic activities;Functional mobility training;Ultrasound;Neuromuscular re-education;Patient/family education;Manual techniques;Taping;Energy conservation;Dry needling;Passive range of motion     PT Next Visit Plan  reassess HEP; thoracic FOTO    Consulted and Agree with Plan of Care  Patient       Patient will benefit from skilled therapeutic intervention in order to improve the following deficits and impairments:  Decreased activity tolerance, Decreased strength, Increased fascial restricitons, Pain, Increased muscle spasms, Decreased range of motion, Improper body mechanics, Postural dysfunction, Impaired flexibility  Visit Diagnosis: Pain in thoracic spine  Abnormal posture  Other muscle spasm     Problem List Patient Active Problem List   Diagnosis Date Noted  . Unilateral primary osteoarthritis, left knee 07/21/2017  . Chronic pain of left knee 07/21/2017  . Status post arthroscopy of left knee 06/23/2017  . Other tear of medial meniscus, current injury, left knee, subsequent encounter 06/02/2017  . De Quervain's tenosynovitis, left 09/06/2015  . Trigger middle finger of right hand 09/06/2015  . Benign essential HTN 06/21/2015  . Mixed hyperlipidemia 06/21/2015  . Type 2 diabetes mellitus without complication (HCC) 06/21/2015  . Myofascial pain 11/24/2012  . Cervical radiculopathy 05/29/2011  . DDD (degenerative disc disease), cervical 05/29/2011     05/31/2011, PT, DPT 07/13/19 3:56 PM   [facility] [address]  [address] [place], [place], [place] Phone: [phone]   Fax:  [phone]  Name: Wendy Vaughn  MRN: [id number] Date of Birth: 22-May-1940

## 2019-07-20 ENCOUNTER — Ambulatory Visit: Payer: Medicare Other | Admitting: Physical Therapy

## 2019-07-20 ENCOUNTER — Other Ambulatory Visit: Payer: Self-pay

## 2019-07-20 ENCOUNTER — Encounter: Payer: Self-pay | Admitting: Physical Therapy

## 2019-07-20 DIAGNOSIS — M546 Pain in thoracic spine: Secondary | ICD-10-CM | POA: Diagnosis not present

## 2019-07-20 DIAGNOSIS — M62838 Other muscle spasm: Secondary | ICD-10-CM

## 2019-07-20 DIAGNOSIS — R293 Abnormal posture: Secondary | ICD-10-CM

## 2019-07-20 NOTE — Therapy (Signed)
[facility] [address]  [place], [place], [address] Phone: [phone]   Fax:  [phone]  Physical Therapy Treatment  Patient Details  Name: Wendy Vaughn MRN: [id number] Date of Birth: [birth year] Referring Provider (PT): Jean Rosenthal, MD   Encounter Date: 07/20/2019  PT End of Session - 07/20/19 1746    Visit Number  2    Number of Visits  7    Date for PT Re-Evaluation  08/24/19    Authorization Type  Medicare & AARP    PT Start Time  3532    PT Stop Time  1753   moist heat   PT Time Calculation (min)  54 min    Activity Tolerance  Patient tolerated treatment well    Behavior During Therapy  [facility] for tasks assessed/performed       History reviewed. No pertinent past medical history.  History reviewed. No pertinent surgical history.  There were no vitals filed for this visit.  Subjective Assessment - 07/20/19 1700    Subjective  Requesting review of HEP as she is unsure of hold times and frequency.    Pertinent History  HTN, cervical radiculopathy, cervical DDD, L de quervain's, HLD, myofascial pain, DM II, L knee arthroscopythoracic FOTO!!L thoracic pain better with laying down modalities2/25/2021 thoracic xray: no acute findings.  There is degenerative changes every level but no compression deformities.    Diagnostic tests  07/08/2019 thoracic xray: no acute findings.  There is degenerative changes every level but no compression deformities.    Patient Stated Goals  "to work in the yard without pain"    Currently in Pain?  Yes    Pain Score  6     Pain Location  Thoracic    Pain Orientation  Left    Pain Descriptors / Indicators  Throbbing    Pain Type  Chronic pain                       [facility] Adult PT Treatment/Exercise - 07/20/19 0001      Exercises   Exercises  Lumbar;Shoulder      Lumbar Exercises: Standing   Other Standing Lumbar Exercises  standing thoracic extension  with elbows on wall x10   heavy cues for form     Lumbar Exercises: Seated   Other Seated Lumbar Exercises  prayer stretch 5x5" each side   cues to maintain proper movement pattern     Shoulder Exercises: Standing   Other Standing Exercises  R/L lat stretch against wall 30"   heavy cues for form     Shoulder Exercises: ROM/Strengthening   UBE (Upper Arm Bike)  L1 x 18min forward, 51min back      Modalities   Modalities  Moist Heat      Moist Heat Therapy   Number Minutes Moist Heat  10 Minutes    Moist Heat Location  --   thoracic spine     Manual Therapy   Manual Therapy  Soft tissue mobilization;Myofascial release;Joint mobilization    Manual therapy comments  prone    Joint Mobilization  T9, T10 central PAs and L unilateral PAs grade II/III to tolerance   pt reported good relief   Soft tissue mobilization  STM/DTM to L thoracolumbar paraspinals and rhomboids- large palpable trigger point at level of T10    Myofascial Release  manual TPR to L lumbar paraspinals at ~T10   c/o tenderness but relieving  PT Education - 07/20/19 1745    Education Details  review of previously administered HEP with cues for proper form and hold time    Person(s) Educated  Patient    Methods  Explanation;Demonstration;Tactile cues;Verbal cues;Handout    Comprehension  Verbalized understanding;Returned demonstration       PT Short Term Goals - 07/20/19 1750      PT SHORT TERM GOAL #1   Title  Patient to be independent with initial HEP.    Time  3    Period  Weeks    Status  On-going    Target Date  08/03/19        PT Long Term Goals - 07/20/19 1750      PT LONG TERM GOAL #1   Title  Patient to be independent with advanced HEP.    Time  6    Period  Weeks    Status  On-going      PT LONG TERM GOAL #2   Title  Patient to demonstrate thoracic AROM WFL and without c/o stiffness.    Time  6    Period  Weeks    Status  On-going      PT LONG TERM GOAL #3   Title   Patient to demonstrate and recall proper postural correction techniques at rest and with activities.    Time  6    Period  Weeks    Status  On-going      PT LONG TERM GOAL #4   Title  Patient to report tolerance for 1.5 hours working in the garden without c/o pain.    Time  6    Period  Weeks    Status  On-going            Plan - 07/20/19 1746    Clinical Impression Statement  Patient requesting review of HEP d/t confusion about recommended frequency and hold times with exercises. Patient required thorough review and correction of form, with minor changes to technique in order to improve tolerance. Patient reported good understanding of HEP. Worked on gentle joint mobilizations to lower thoracic spine to area of patient's pain. Patient noted relief with joint mobilizations. Demonstrated palpable tender trigger point in L lumbar paraspinals with STM and TPR. Ended session with moist heat to thoracic spine. Reported good improvement in symptoms at end of session.    Comorbidities  HTN, cervical radiculopathy, cervical DDD, L de quervain's, HLD, myofascial pain, DM II, L knee arthroscopy    PT Treatment/Interventions  ADLs/Self Care Home Management;Cryotherapy;Electrical Stimulation;Iontophoresis 4mg /ml Dexamethasone;Moist Heat;Therapeutic exercise;Therapeutic activities;Functional mobility training;Ultrasound;Neuromuscular re-education;Patient/family education;Manual techniques;Taping;Energy conservation;Dry needling;Passive range of motion    PT Next Visit Plan  reassess HEP    Consulted and Agree with Plan of Care  Patient       Patient will benefit from skilled therapeutic intervention in order to improve the following deficits and impairments:     Visit Diagnosis: Pain in thoracic spine  Abnormal posture  Other muscle spasm     Problem List Patient Active Problem List   Diagnosis Date Noted  . Unilateral primary osteoarthritis, left knee 07/21/2017  . Chronic pain of left  knee 07/21/2017  . Status post arthroscopy of left knee 06/23/2017  . Other tear of medial meniscus, current injury, left knee, subsequent encounter 06/02/2017  . De Quervain's tenosynovitis, left 09/06/2015  . Trigger middle finger of right hand 09/06/2015  . Benign essential HTN 06/21/2015  . Mixed hyperlipidemia 06/21/2015  . Type 2 diabetes  mellitus without complication (HCC) 06/21/2015  . Myofascial pain 11/24/2012  . Cervical radiculopathy 05/29/2011  . DDD (degenerative disc disease), cervical 05/29/2011     Anette Guarneri, PT, DPT 07/20/19 5:56 PM   [facility] Outpatient [facility] [address]  [address] [place], [place], [address] Phone: [phone]   Fax:  [phone]  Name: Wendy Vaughn MRN: [id number] Date of Birth: [birth year]

## 2019-07-27 ENCOUNTER — Ambulatory Visit: Payer: Medicare Other

## 2019-07-27 ENCOUNTER — Other Ambulatory Visit: Payer: Self-pay

## 2019-07-27 DIAGNOSIS — R293 Abnormal posture: Secondary | ICD-10-CM

## 2019-07-27 DIAGNOSIS — M62838 Other muscle spasm: Secondary | ICD-10-CM

## 2019-07-27 DIAGNOSIS — M546 Pain in thoracic spine: Secondary | ICD-10-CM | POA: Diagnosis not present

## 2019-07-27 NOTE — Therapy (Signed)
[facility] Outpatient Rehabilitation [facility] [address]  [address] [place], [place], [address] Phone: [phone]   Fax:  [phone]  Physical Therapy Treatment  Patient Details  Name: Wendy Vaughn MRN: [id number] Date of Birth: 02/28/1941 Referring Provider (PT): Doneen Poisson, MD   Encounter Date: 07/27/2019  PT End of Session - 07/27/19 1724    Visit Number  3    Number of Visits  7    Date for PT Re-Evaluation  08/24/19    Authorization Type  Medicare & AARP    PT Start Time  1450    PT Stop Time  1535    PT Time Calculation (min)  45 min    Activity Tolerance  Patient tolerated treatment well    Behavior During Therapy  [facility] for tasks assessed/performed       History reviewed. No pertinent past medical history.  History reviewed. No pertinent surgical history.  There were no vitals filed for this visit.  Subjective Assessment - 07/27/19 1454    Subjective  Pt reports she feels a little better, but not sure if it's because she's limiting activity with poor weather or it's related to PT and HEP. Pt reports she was able to attend [employer] [employer] tournament with only 1/10 pain, "I know it's there". When "I really hurt, it's when I get out in the yard."    Pertinent History  HTN, cervical radiculopathy, cervical DDD, L de quervain's, HLD, myofascial pain, DM II, L knee arthroscopythoracic FOTO!!L thoracic pain better with laying down modalities2/25/2021 thoracic xray: no acute findings.  There is degenerative changes every level but no compression deformities.    Diagnostic tests  07/08/2019 thoracic xray: no acute findings.  There is degenerative changes every level but no compression deformities.    Patient Stated Goals  "to work in the yard without pain"    Currently in Pain?  No/denies                       [facility] Adult PT Treatment/Exercise - 07/27/19 0001      Exercises   Exercises  Shoulder;Lumbar      Lumbar Exercises: Sidelying   Other Sidelying Lumbar Exercises  L book openers in R sidelying 1 x 15 with PT close to pt for safety on high low table      Shoulder Exercises: Isometric Strengthening   External Rotation  5X5";Other (comment)   2 sets, red theraband   External Rotation Limitations  at doorway corner      Shoulder Exercises: Stretch   Other Shoulder Stretches  doorway chest opener and postural alignmen  2 x 30"      Manual Therapy   Manual Therapy  Joint mobilization;Soft tissue mobilization;Myofascial release    Manual therapy comments  prone and sidelying    Joint Mobilization  L UPAs T5-9 grade II-III, CPAS mid thoracic grade III-IV    Soft tissue mobilization  STM/DTM to L thoracic PS    Myofascial Release  with sustained rib rocking to L             PT Education - 07/27/19 1724    Education Details  Pt educated on sidelying L book openers to continue to increase L thoracic rotation, as well as standing posture interruptions with any prolonged sitting and bending over.    Person(s) Educated  Patient    Methods  Explanation;Handout;Demonstration;Verbal cues;Tactile cues    Comprehension  Verbalized understanding;Returned demonstration  PT Short Term Goals - 07/20/19 1750      PT SHORT TERM GOAL #1   Title  Patient to be independent with initial HEP.    Time  3    Period  Weeks    Status  On-going    Target Date  08/03/19        PT Long Term Goals - 07/20/19 1750      PT LONG TERM GOAL #1   Title  Patient to be independent with advanced HEP.    Time  6    Period  Weeks    Status  On-going      PT LONG TERM GOAL #2   Title  Patient to demonstrate thoracic AROM WFL and without c/o stiffness.    Time  6    Period  Weeks    Status  On-going      PT LONG TERM GOAL #3   Title  Patient to demonstrate and recall proper postural correction techniques at rest and with activities.    Time  6    Period  Weeks    Status  On-going      PT LONG TERM GOAL #4   Title  Patient  to report tolerance for 1.5 hours working in the garden without c/o pain.    Time  6    Period  Weeks    Status  On-going            Plan - 07/27/19 1725    Clinical Impression Statement  Pt presents with extremely limited L thoracic rotation AROM in sitting with discomfort reported at end range. She had excellent response to manual therapy including L rib mobilization rocking, thoracic L UPAs and CPAs, and STM to L thoracic PS, as well as lumbar gapping with focus on L thoracic rotation. Pt demontrated significant increase in L pain free thoracic rotation following manual therapy and thera ex with greater AROM than R thoracic rotation. She was educated on addition of sidelying book openers for L thoracic rotation to HEP and increase in focus on extension posturing following prolonged sitting.    Comorbidities  HTN, cervical radiculopathy, cervical DDD, L de quervain's, HLD, myofascial pain, DM II, L knee arthroscopy    Rehab Potential  Good    PT Treatment/Interventions  ADLs/Self Care Home Management;Cryotherapy;Electrical Stimulation;Iontophoresis 4mg /ml Dexamethasone;Moist Heat;Therapeutic exercise;Therapeutic activities;Functional mobility training;Ultrasound;Neuromuscular re-education;Patient/family education;Manual techniques;Taping;Energy conservation;Dry needling;Passive range of motion    PT Next Visit Plan  continue to assess and progress posture and thoracic mobility    PT Home Exercise Plan  added L thoracic rotations in sidelying    Consulted and Agree with Plan of Care  Patient       Patient will benefit from skilled therapeutic intervention in order to improve the following deficits and impairments:  Decreased activity tolerance, Decreased strength, Increased fascial restricitons, Pain, Increased muscle spasms, Decreased range of motion, Improper body mechanics, Postural dysfunction, Impaired flexibility  Visit Diagnosis: Pain in thoracic spine  Abnormal posture  Other  muscle spasm     Problem List Patient Active Problem List   Diagnosis Date Noted  . Unilateral primary osteoarthritis, left knee 07/21/2017  . Chronic pain of left knee 07/21/2017  . Status post arthroscopy of left knee 06/23/2017  . Other tear of medial meniscus, current injury, left knee, subsequent encounter 06/02/2017  . De Quervain's tenosynovitis, left 09/06/2015  . Trigger middle finger of right hand 09/06/2015  . Benign essential HTN 06/21/2015  . Mixed hyperlipidemia  06/21/2015  . Type 2 diabetes mellitus without complication (HCC) 06/21/2015  . Myofascial pain 11/24/2012  . Cervical radiculopathy 05/29/2011  . DDD (degenerative disc disease), cervical 05/29/2011    Marcelline Mates, PT, DPT 07/27/2019, 5:45 PM  [facility] [address]  [address] [place], [place], [address] Phone: [phone]   Fax:  [phone]  Name: Wendy Vaughn MRN: [id number] Date of Birth: 1940/11/08

## 2019-08-03 ENCOUNTER — Ambulatory Visit: Payer: Medicare Other | Admitting: Physical Therapy

## 2019-08-03 ENCOUNTER — Encounter: Payer: Self-pay | Admitting: Physical Therapy

## 2019-08-03 ENCOUNTER — Other Ambulatory Visit: Payer: Self-pay

## 2019-08-03 DIAGNOSIS — M62838 Other muscle spasm: Secondary | ICD-10-CM

## 2019-08-03 DIAGNOSIS — M546 Pain in thoracic spine: Secondary | ICD-10-CM | POA: Diagnosis not present

## 2019-08-03 DIAGNOSIS — R293 Abnormal posture: Secondary | ICD-10-CM

## 2019-08-03 NOTE — Therapy (Signed)
[facility] Outpatient Rehabilitation [facility] [address]  [address] [place], [place], [address] Phone: [phone]   Fax:  [phone]  Physical Therapy Treatment  Patient Details  Name: Wendy Vaughn MRN: [id number] Date of Birth: July 12, 1940 Referring Provider (PT): Doneen Poisson, MD   Encounter Date: 08/03/2019  PT End of Session - 08/03/19 1442    Visit Number  4    Number of Visits  7    Date for PT Re-Evaluation  08/24/19    Authorization Type  Medicare & AARP    PT Start Time  1400    PT Stop Time  1441    PT Time Calculation (min)  41 min    Activity Tolerance  Patient tolerated treatment well    Behavior During Therapy  [facility] for tasks assessed/performed       History reviewed. No pertinent past medical history.  History reviewed. No pertinent surgical history.  There were no vitals filed for this visit.  Subjective Assessment - 08/03/19 1402    Subjective  Feeling about the same- still having pain when picking up sticks in the yard and weeding. Exercises that are getting "somewhat" better.    Pertinent History  HTN, cervical radiculopathy, cervical DDD, L de quervain's, HLD, myofascial pain, DM II, L knee arthroscopythoracic FOTO!!L thoracic pain better with laying down modalities2/25/2021 thoracic xray: no acute findings.  There is degenerative changes every level but no compression deformities.    Diagnostic tests  07/08/2019 thoracic xray: no acute findings.  There is degenerative changes every level but no compression deformities.    Patient Stated Goals  "to work in the yard without pain"    Currently in Pain?  No/denies         [facility] PT Assessment - 08/03/19 0001      AROM   Thoracic Flexion  [facility]    Thoracic Extension  moderately limited    Thoracic - Right Side Bend  mildly limited    Thoracic - Left Side Bend  mildly limited    Thoracic - Right Rotation  mildly limited    Thoracic - Left Rotation  mildly limited                    [facility] Adult PT Treatment/Exercise - 08/03/19 0001      Lumbar Exercises: Standing   Row  Strengthening;Both;10 reps;Theraband    Theraband Level (Row)  Level 2 (Red)    Row Limitations  cues for scapular retraction and depression and maintaining shoulder in neutral      Lumbar Exercises: Seated   Other Seated Lumbar Exercises  sitting thoracic extension over bach of chair x10 to tolerance      Lumbar Exercises: Sidelying   Other Sidelying Lumbar Exercises  R/L open book stretch x10 each side to tolerance      Lumbar Exercises: Prone   Other Prone Lumbar Exercises  prone on elbows x10   limited ROM     Shoulder Exercises: ROM/Strengthening   UBE (Upper Arm Bike)  L1 x forward, back      Manual Therapy   Manual Therapy  Joint mobilization    Manual therapy comments  prone    Joint Mobilization  gentle central PAs over T3-10 grade III-IV to tolerance   TTP and hypomobile at T3, 5, 7            PT Education - 08/03/19 1442    Education Details  update to HEP  Person(s) Educated  Patient    Methods  Explanation;Demonstration;Tactile cues;Verbal cues;Handout    Comprehension  Returned demonstration;Verbalized understanding       PT Short Term Goals - 08/03/19 1427      PT SHORT TERM GOAL #1   Title  Patient to be independent with initial HEP.    Time  3    Period  Weeks    Status  On-going    Target Date  08/03/19        PT Long Term Goals - 08/03/19 1427      PT LONG TERM GOAL #1   Title  Patient to be independent with advanced HEP.    Time  6    Period  Weeks    Status  On-going   still requiring consistent cueing for correct form with HEP     PT LONG TERM GOAL #2   Title  Patient to demonstrate thoracic AROM WFL and without c/o stiffness.    Time  6    Period  Weeks    Status  On-going   pain improved with extension, ROM still limited     PT LONG TERM GOAL #3   Title  Patient to demonstrate and recall proper  postural correction techniques at rest and with activities.    Time  6    Period  Weeks    Status  On-going   reporting "slightly" more awareness of her posture     PT LONG TERM GOAL #4   Title  Patient to report tolerance for 1.5 hours working in the garden without c/o pain.    Time  6    Period  Weeks    Status  On-going   reports 10 min until onset of pain           Plan - 08/03/19 1443    Clinical Impression Statement  Patient reporting continued pain with yardwork, but is noticing that she is now pain-free at rest and having quicker resolution of pain. Patient reported some relief with MT last session, thus continued with thoracic central joint mobilizations. Patient most TTP and hypomobile at T3, T5, and T7. Followed up with sitting thoracic extension which patient reported good tolerance with. Expressed that she did not feel that she was performing open book stretch correctly, thus this was reviewed for form. Patient now noting "slightly" more awareness of her posture- counseled patient more on this as her posture may have a direct effect on her pain. Patient now noting no pain with thoracic extension AROM, however ROM is still limited. Still limited to about 10 min of yardwork until onset of pain. Patient reported understanding of HEP update with sitting thoracic extension. Reported no complaints at end of session. Patient showing slow but steady progress towards goals.    Comorbidities  HTN, cervical radiculopathy, cervical DDD, L de quervain's, HLD, myofascial pain, DM II, L knee arthroscopy    Rehab Potential  Good    PT Treatment/Interventions  ADLs/Self Care Home Management;Cryotherapy;Electrical Stimulation;Iontophoresis 4mg /ml Dexamethasone;Moist Heat;Therapeutic exercise;Therapeutic activities;Functional mobility training;Ultrasound;Neuromuscular re-education;Patient/family education;Manual techniques;Taping;Energy conservation;Dry needling;Passive range of motion    PT Next  Visit Plan  continue to assess and progress posture and thoracic mobility    PT Home Exercise Plan  added L thoracic rotations in sidelying    Consulted and Agree with Plan of Care  Patient       Patient will benefit from skilled therapeutic intervention in order to improve the following deficits and impairments:  Decreased  activity tolerance, Decreased strength, Increased fascial restricitons, Pain, Increased muscle spasms, Decreased range of motion, Improper body mechanics, Postural dysfunction, Impaired flexibility  Visit Diagnosis: Pain in thoracic spine  Abnormal posture  Other muscle spasm     Problem List Patient Active Problem List   Diagnosis Date Noted  . Unilateral primary osteoarthritis, left knee 07/21/2017  . Chronic pain of left knee 07/21/2017  . Status post arthroscopy of left knee 06/23/2017  . Other tear of medial meniscus, current injury, left knee, subsequent encounter 06/02/2017  . De Quervain's tenosynovitis, left 09/06/2015  . Trigger middle finger of right hand 09/06/2015  . Benign essential HTN 06/21/2015  . Mixed hyperlipidemia 06/21/2015  . Type 2 diabetes mellitus without complication ([place]) 91/69/4503  . Myofascial pain 11/24/2012  . Cervical radiculopathy 05/29/2011  . DDD (degenerative disc disease), cervical 05/29/2011     Janene Harvey, PT, DPT 08/03/19 4:49 PM   [facility] [address]  [place], [place], [address] Phone: [phone]   Fax:  [phone]  Name: Wendy Vaughn MRN: [id number] Date of Birth: [birth year]

## 2019-08-05 ENCOUNTER — Other Ambulatory Visit: Payer: Self-pay

## 2019-08-05 ENCOUNTER — Ambulatory Visit (INDEPENDENT_AMBULATORY_CARE_PROVIDER_SITE_OTHER): Payer: Medicare Other | Admitting: Orthopaedic Surgery

## 2019-08-05 ENCOUNTER — Encounter: Payer: Self-pay | Admitting: Orthopaedic Surgery

## 2019-08-05 DIAGNOSIS — M549 Dorsalgia, unspecified: Secondary | ICD-10-CM

## 2019-08-05 MED ORDER — METHYLPREDNISOLONE ACETATE 40 MG/ML IJ SUSP
40.0000 mg | INTRAMUSCULAR | Status: AC | PRN
  Administered 2019-08-05: 15:00:00 40 mg via INTRAMUSCULAR

## 2019-08-05 MED ORDER — LIDOCAINE HCL 1 % IJ SOLN
1.0000 mL | INTRAMUSCULAR | Status: AC | PRN
  Administered 2019-08-05: 15:00:00 1 mL

## 2019-08-05 NOTE — Progress Notes (Signed)
Office Visit Note   Patient: Wendy Vaughn           Date of Birth: 10-11-1940           MRN: [id number] Visit Date: 08/05/2019              Requested by: Nicola Girt, [address] [place],  [place] [address] PCP: Nicola Girt, DO   Assessment & Plan: Visit Diagnoses:  1. Mid back pain     Plan: I do feel it is worthwhile trying at least a trigger point injection on the point of maximum tenderness around her back.  I did find an area of the lower thoracic spine where she was tender and then placed an injection of 1 cc lidocaine 1 cc Depo-Medrol is a trigger point which she tolerated well.  She will continue her last few therapy visits.  All question concerns were answered and addressed.  Follow-up is otherwise as needed.  Follow-Up Instructions: No follow-ups on file.   Orders:  Orders Placed This Encounter  Procedures  . Trigger Point Inj   No orders of the defined types were placed in this encounter.     Procedures: Trigger Point Inj  Date/Time: 08/05/2019 3:00 PM Performed by: Mcarthur Rossetti, MD Authorized by: Mcarthur Rossetti, MD   Total # of Trigger Points:  1 Location: back   Medications #1:  1 mL lidocaine 1 %; 40 mg methylPREDNISolone acetate 40 MG/ML     Clinical Data: No additional findings.   Subjective: Chief Complaint  Patient presents with  . Middle Back - Follow-up  Wendy Vaughn comes in today for continued evaluation of upper lumbar/lower thoracic pain.  She does state that outpatient physical therapy has helped her quite a bit.  When she does yard work she used to her for 2 days and now she only hurts for 1 day.  She was able to go to some of the games of the [employer] tournament and stand for a while.  She does try some Voltaren gel on her back.  Overall she does feel like she is improving slowly.  She does have a few therapy sessions left.  HPI  Review of Systems She currently denies any headache, chest pain,  shortness of breath, fever, chills, nausea, vomiting  Objective: Vital Signs: There were no vitals taken for this visit.  Physical Exam She is alert and orient x3 and in no acute distress Ortho Exam Examination of her lower thoracic spine does show some pain almost in the midline and I can find an area that is somewhat like a trigger point in terms of the pain. Specialty Comments:  No specialty comments available.  Imaging: No results found.   PMFS History: Patient Active Problem List   Diagnosis Date Noted  . Unilateral primary osteoarthritis, left knee 07/21/2017  . Chronic pain of left knee 07/21/2017  . Status post arthroscopy of left knee 06/23/2017  . Other tear of medial meniscus, current injury, left knee, subsequent encounter 06/02/2017  . De Quervain's tenosynovitis, left 09/06/2015  . Trigger middle finger of right hand 09/06/2015  . Benign essential HTN 06/21/2015  . Mixed hyperlipidemia 06/21/2015  . Type 2 diabetes mellitus without complication ([place]) 96/78/9381  . Myofascial pain 11/24/2012  . Cervical radiculopathy 05/29/2011  . DDD (degenerative disc disease), cervical 05/29/2011   History reviewed. No pertinent past medical history.  History reviewed. No pertinent family history.  History reviewed. No pertinent surgical  history. Social History   Occupational History  . Not on file  Tobacco Use  . Smoking status: Not on file  Substance and Sexual Activity  . Alcohol use: Not on file  . Drug use: Not on file  . Sexual activity: Not on file

## 2019-08-10 ENCOUNTER — Other Ambulatory Visit: Payer: Self-pay

## 2019-08-10 ENCOUNTER — Ambulatory Visit: Payer: Medicare Other

## 2019-08-10 DIAGNOSIS — M62838 Other muscle spasm: Secondary | ICD-10-CM

## 2019-08-10 DIAGNOSIS — M546 Pain in thoracic spine: Secondary | ICD-10-CM | POA: Diagnosis not present

## 2019-08-10 DIAGNOSIS — R293 Abnormal posture: Secondary | ICD-10-CM

## 2019-08-10 NOTE — Therapy (Signed)
[facility] [address]  [place], [place], [address] Phone: [phone]   Fax:  [phone]  Physical Therapy Treatment  Patient Details  Name: Wendy Vaughn MRN: [id number] Date of Birth: [birth year] Referring Provider (PT): Jean Rosenthal, MD   Encounter Date: 08/10/2019  PT End of Session - 08/10/19 1457    Visit Number  5    Number of Visits  7    Date for PT Re-Evaluation  08/24/19    Authorization Type  Medicare & AARP    PT Start Time  6962    PT Stop Time  1528    PT Time Calculation (min)  43 min    Activity Tolerance  Patient tolerated treatment well    Behavior During Therapy  [facility] for tasks assessed/performed       No past medical history on file.  No past surgical history on file.  There were no vitals filed for this visit.  Subjective Assessment - 08/10/19 1450    Subjective  Pt. reporting she had difficulty getting up from low stool in graveyard over weekend and "tweaked" her R hip getting up.  Also tweaked her mid back getting out of her daughter's sports car.    Pertinent History  HTN, cervical radiculopathy, cervical DDD, L de quervain's, HLD, myofascial pain, DM II, L knee arthroscopythoracic FOTO!!L thoracic pain better with laying down modalities2/25/2021 thoracic xray: no acute findings.  There is degenerative changes every level but no compression deformities.    Diagnostic tests  07/08/2019 thoracic xray: no acute findings.  There is degenerative changes every level but no compression deformities.    Patient Stated Goals  "to work in the yard without pain"    Currently in Pain?  No/denies    Pain Score  1    up to 5-6/10 at worst   Pain Location  Thoracic    Pain Orientation  Left    Pain Descriptors / Indicators  Throbbing    Pain Type  Chronic pain    Pain Onset  More than a month ago    Pain Frequency  Intermittent    Aggravating Factors   getting out of low sports car    Multiple Pain Sites  Yes    Pain Score  0   R hip pain up to 4-5/10 however unsure of trigger   Pain Location  Hip    Pain Orientation  Right    Pain Descriptors / Indicators  --   "catching pain"   Pain Onset  In the past 7 days    Pain Frequency  Intermittent    Aggravating Factors   unsure                       [facility] Adult PT Treatment/Exercise - 08/10/19 0001      Lumbar Exercises: Standing   Row  15 reps;Both;Strengthening;Theraband    Theraband Level (Row)  Level 2 (Red)    Row Limitations  cues for scapular retraction     Shoulder Extension  10 reps;Strengthening;Both;Theraband    Theraband Level (Shoulder Extension)  Level 2 (Red)    Shoulder Extension Limitations  cues for upright posture     Other Standing Lumbar Exercises  standing lumbar extension at counter 3" x 10 reps       Lumbar Exercises: Sidelying   Other Sidelying Lumbar Exercises  R/L open book stretch x10 each side to tolerance  Lumbar Exercises: Prone   Other Prone Lumbar Exercises  prone on elbows x10      Shoulder Exercises: ROM/Strengthening   UBE (Upper Arm Bike)  L1 x forward, back      Shoulder Exercises: Stretch   Corner Stretch  1 rep;30 seconds    Corner Stretch Limitations  single arm chest stretch mid on doorframe                PT Short Term Goals - 08/10/19 1729      PT SHORT TERM GOAL #1   Title  Patient to be independent with initial HEP.    Time  3    Period  Weeks    Status  Achieved    Target Date  08/03/19        PT Long Term Goals - 08/03/19 1427      PT LONG TERM GOAL #1   Title  Patient to be independent with advanced HEP.    Time  6    Period  Weeks    Status  On-going   still requiring consistent cueing for correct form with HEP     PT LONG TERM GOAL #2   Title  Patient to demonstrate thoracic AROM WFL and without c/o stiffness.    Time  6    Period  Weeks    Status  On-going   pain improved with extension, ROM still  limited     PT LONG TERM GOAL #3   Title  Patient to demonstrate and recall proper postural correction techniques at rest and with activities.    Time  6    Period  Weeks    Status  On-going   reporting "slightly" more awareness of her posture     PT LONG TERM GOAL #4   Title  Patient to report tolerance for 1.5 hours working in the garden without c/o pain.    Time  6    Period  Weeks    Status  On-going   reports 10 min until onset of pain           Plan - 08/10/19 1452    Clinical Impression Statement  Pt. reports she "tweaked" her R hip while getting up from low stool while visiting graveyard over weekend.  Also "tweaked" her mid back while getting out of her daughter's low sports car.  Feels her back and hip pain have improved some since these incidents.  Was able to progress through postural and thoracic ROM/strengthening activities in today's session without significant pain.  Did require cueing for proper scapular mechanics and adjustment with pec stretch on doorway to feel appropriate stretch sensation.  Was agreeable throughout session today.  Has achieved STG #1 as she notes she is performing HEP without questions.    Comorbidities  HTN, cervical radiculopathy, cervical DDD, L de quervain's, HLD, myofascial pain, DM II, L knee arthroscopy    Rehab Potential  Good    PT Frequency  1x / week    PT Treatment/Interventions  ADLs/Self Care Home Management;Cryotherapy;Electrical Stimulation;Iontophoresis 4mg /ml Dexamethasone;Moist Heat;Therapeutic exercise;Therapeutic activities;Functional mobility training;Ultrasound;Neuromuscular re-education;Patient/family education;Manual techniques;Taping;Energy conservation;Dry needling;Passive range of motion    PT Next Visit Plan  continue to assess and progress posture and thoracic mobility    PT Home Exercise Plan  added L thoracic rotations in sidelying    Consulted and Agree with Plan of Care  Patient       Patient will benefit from  skilled therapeutic intervention  in order to improve the following deficits and impairments:  Decreased activity tolerance, Decreased strength, Increased fascial restricitons, Pain, Increased muscle spasms, Decreased range of motion, Improper body mechanics, Postural dysfunction, Impaired flexibility  Visit Diagnosis: Pain in thoracic spine  Abnormal posture  Other muscle spasm     Problem List Patient Active Problem List   Diagnosis Date Noted  . Unilateral primary osteoarthritis, left knee 07/21/2017  . Chronic pain of left knee 07/21/2017  . Status post arthroscopy of left knee 06/23/2017  . Other tear of medial meniscus, current injury, left knee, subsequent encounter 06/02/2017  . De Quervain's tenosynovitis, left 09/06/2015  . Trigger middle finger of right hand 09/06/2015  . Benign essential HTN 06/21/2015  . Mixed hyperlipidemia 06/21/2015  . Type 2 diabetes mellitus without complication ([facility]) 06/21/2015  . Myofascial pain 11/24/2012  . Cervical radiculopathy 05/29/2011  . DDD (degenerative disc disease), cervical 05/29/2011    Kermit Balo, PTA 08/10/19 5:33 PM   [facility] Outpatient Rehabilitation [facility] [address]  [address] [place], [place], [address] Phone: [phone]   Fax:  [phone]  Name: Wendy Vaughn MRN: [id number] Date of Birth: 1940/07/01

## 2019-08-17 ENCOUNTER — Ambulatory Visit: Payer: Medicare Other | Attending: Orthopaedic Surgery

## 2019-08-17 ENCOUNTER — Other Ambulatory Visit: Payer: Self-pay

## 2019-08-17 DIAGNOSIS — M546 Pain in thoracic spine: Secondary | ICD-10-CM | POA: Diagnosis present

## 2019-08-17 DIAGNOSIS — R293 Abnormal posture: Secondary | ICD-10-CM | POA: Diagnosis present

## 2019-08-17 DIAGNOSIS — M62838 Other muscle spasm: Secondary | ICD-10-CM | POA: Diagnosis present

## 2019-08-17 NOTE — Therapy (Signed)
[facility] [address]  [address] [place], [place], [address] Phone: [phone]   Fax:  [phone]  Physical Therapy Treatment  Patient Details  Name: Wendy Vaughn MRN: [id number] Date of Birth: [birth year] Referring Provider (PT): Doneen Poisson, MD   Encounter Date: 08/17/2019  PT End of Session - 08/17/19 1450    Visit Number  6    Number of Visits  7    Date for PT Re-Evaluation  08/24/19    Authorization Type  Medicare & AARP    PT Start Time  1446    PT Stop Time  1524    PT Time Calculation (min)  38 min    Activity Tolerance  Patient tolerated treatment well    Behavior During Therapy  [facility] for tasks assessed/performed       No past medical history on file.  No past surgical history on file.  There were no vitals filed for this visit.  Subjective Assessment - 08/17/19 1449    Subjective  Pt. reporting she feels her R hip and back pain has resolved since her incidents last week.    Pertinent History  HTN, cervical radiculopathy, cervical DDD, L de quervain's, HLD, myofascial pain, DM II, L knee arthroscopythoracic FOTO!!L thoracic pain better with laying down modalities2/25/2021 thoracic xray: no acute findings.  There is degenerative changes every level but no compression deformities.    Diagnostic tests  07/08/2019 thoracic xray: no acute findings.  There is degenerative changes every level but no compression deformities.    Patient Stated Goals  "to work in the yard without pain"    Currently in Pain?  No/denies    Pain Score  0-No pain                       [facility] Adult PT Treatment/Exercise - 08/17/19 0001      Self-Care   Self-Care  Other Self-Care Comments    Other Self-Care Comments   Reviewed comprehensive HEP to check for understanding and need for update       Lumbar Exercises: Aerobic   UBE (Upper Arm Bike)  --      Lumbar Exercises: Supine   Bridge  10 reps;3 seconds      Lumbar Exercises: Sidelying   Other Sidelying Lumbar Exercises  R/L open book stretch x10 each side to tolerance      Shoulder Exercises: Supine   Horizontal ABduction  Both;10 reps;Strengthening;Theraband    Theraband Level (Shoulder Horizontal ABduction)  Level 2 (Red)    Horizontal ABduction Limitations  Hooklying on table     External Rotation  Both;10 reps;Strengthening;Theraband    Theraband Level (Shoulder External Rotation)  Level 2 (Red)    External Rotation Limitations  Hooklying on mat table     Flexion  Right;Left;Strengthening;10 reps;Theraband    Theraband Level (Shoulder Flexion)  Level 2 (Red)    Flexion Limitations  alternating flexion/extension with red TB       Shoulder Exercises: ROM/Strengthening   UBE (Upper Arm Bike)  L1.5x forward, back      Shoulder Exercises: Stretch   Other Shoulder Stretches  Hooklying chest stretch x 1 min in "cross position"             PT Education - 08/17/19 1546    Education Details  HEP update;  red TB row,  red TB horizontal abduction, mid doorway chest stretch    Person(s) Educated  Patient    Methods  Explanation;Demonstration;Verbal cues;Handout    Comprehension  Returned demonstration;Verbal cues required       PT Short Term Goals - 08/10/19 1729      PT SHORT TERM GOAL #1   Title  Patient to be independent with initial HEP.    Time  3    Period  Weeks    Status  Achieved    Target Date  08/03/19        PT Long Term Goals - 08/03/19 1427      PT LONG TERM GOAL #1   Title  Patient to be independent with advanced HEP.    Time  6    Period  Weeks    Status  On-going   still requiring consistent cueing for correct form with HEP     PT LONG TERM GOAL #2   Title  Patient to demonstrate thoracic AROM WFL and without c/o stiffness.    Time  6    Period  Weeks    Status  On-going   pain improved with extension, ROM still limited     PT LONG TERM GOAL #3   Title  Patient to demonstrate and recall  proper postural correction techniques at rest and with activities.    Time  6    Period  Weeks    Status  On-going   reporting "slightly" more awareness of her posture     PT LONG TERM GOAL #4   Title  Patient to report tolerance for 1.5 hours working in the garden without c/o pain.    Time  6    Period  Weeks    Status  On-going   reports 10 min until onset of pain           Plan - 08/17/19 1451    Clinical Impression Statement  Wendy Vaughn reports 20-30% improvement in her pain levels since starting therapy.  Made aware she is approaching the end of her current POC and pt. reporting she would like to transition to home program and finish with therapy after next session.  Session focused on postural strengthening activities and reviewing HEP with update to prep pt. for anticipated transition to home program per her wishes at next session.  Able to perform all therex today without pain.  Will monitor response to updated HEP in coming session.    Comorbidities  HTN, cervical radiculopathy, cervical DDD, L de quervain's, HLD, myofascial pain, DM II, L knee arthroscopy    Rehab Potential  Good    PT Frequency  1x / week    PT Treatment/Interventions  ADLs/Self Care Home Management;Cryotherapy;Electrical Stimulation;Iontophoresis 4mg /ml Dexamethasone;Moist Heat;Therapeutic exercise;Therapeutic activities;Functional mobility training;Ultrasound;Neuromuscular re-education;Patient/family education;Manual techniques;Taping;Energy conservation;Dry needling;Passive range of motion    PT Next Visit Plan  transition to home program    Consulted and Agree with Plan of Care  Patient       Patient will benefit from skilled therapeutic intervention in order to improve the following deficits and impairments:  Decreased activity tolerance, Decreased strength, Increased fascial restricitons, Pain, Increased muscle spasms, Decreased range of motion, Improper body mechanics, Postural dysfunction, Impaired  flexibility  Visit Diagnosis: Pain in thoracic spine  Abnormal posture  Other muscle spasm     Problem List Patient Active Problem List   Diagnosis Date Noted  . Unilateral primary osteoarthritis, left knee 07/21/2017  . Chronic pain of left knee 07/21/2017  . Status post arthroscopy of left knee 06/23/2017  . Other tear of  medial meniscus, current injury, left knee, subsequent encounter 06/02/2017  . De Quervain's tenosynovitis, left 09/06/2015  . Trigger middle finger of right hand 09/06/2015  . Benign essential HTN 06/21/2015  . Mixed hyperlipidemia 06/21/2015  . Type 2 diabetes mellitus without complication ([place]) 94/80/1655  . Myofascial pain 11/24/2012  . Cervical radiculopathy 05/29/2011  . DDD (degenerative disc disease), cervical 05/29/2011    Bess Harvest, PTA 08/17/19 3:52 PM   [facility] [address]  [place], [place], [address] Phone: [phone]   Fax:  [phone]  Name: Wendy Vaughn MRN: [id number] Date of Birth: [birth year]

## 2019-08-24 ENCOUNTER — Ambulatory Visit: Payer: Medicare Other | Admitting: Physical Therapy

## 2019-08-24 ENCOUNTER — Other Ambulatory Visit: Payer: Self-pay

## 2019-08-24 ENCOUNTER — Encounter: Payer: Self-pay | Admitting: Physical Therapy

## 2019-08-24 DIAGNOSIS — R293 Abnormal posture: Secondary | ICD-10-CM

## 2019-08-24 DIAGNOSIS — M546 Pain in thoracic spine: Secondary | ICD-10-CM

## 2019-08-24 DIAGNOSIS — M62838 Other muscle spasm: Secondary | ICD-10-CM

## 2019-08-24 NOTE — Therapy (Signed)
[facility] [address]  [place], [place], [address] Phone: [phone]   Fax:  [phone]  Physical Therapy Discharge Summary  Patient Details  Name: Wendy Vaughn MRN: [id number] Date of Birth: Jun 06, 1940 Referring Provider (PT): Jean Rosenthal, MD   Progress Note Reporting Period 07/13/19 to 08/24/19  See note below for Objective Data and Assessment of Progress/Goals.     Encounter Date: 08/24/2019  PT End of Session - 08/24/19 1455    Visit Number  7    Number of Visits  7    Date for PT Re-Evaluation  08/24/19    Authorization Type  Medicare & AARP    PT Start Time  6256    PT Stop Time  1443    PT Time Calculation (min)  44 min    Activity Tolerance  Patient tolerated treatment well    Behavior During Therapy  [facility] for tasks assessed/performed       History reviewed. No pertinent past medical history.  History reviewed. No pertinent surgical history.  There were no vitals filed for this visit.  Subjective Assessment - 08/24/19 1401    Subjective  Hasn't been feeling well lately- having GI issues. Feels comfortable with HEP and would like to wrap up today. Noting improvement in pain levels as she was able to spread pine needles and pull weeds over 2 days without increase in pain in midback. Reports 50-60% improvement since initial eval.    Pertinent History  HTN, cervical radiculopathy, cervical DDD, L de quervain's, HLD, myofascial pain, DM II, L knee arthroscopythoracic FOTO!!L thoracic pain better with laying down modalities2/25/2021 thoracic xray: no acute findings.  There is degenerative changes every level but no compression deformities.    Diagnostic tests  07/08/2019 thoracic xray: no acute findings.  There is degenerative changes every level but no compression deformities.    Patient Stated Goals  "to work in the yard without pain"    Currently in Pain?  No/denies         [facility] PT  Assessment - 08/24/19 0001      AROM   AROM Assessment Site  Thoracic    Thoracic Flexion  WFL    Thoracic Extension  moderately limited    Thoracic - Right Side Bend  mildly limited    Thoracic - Left Side Bend  mildly limited    Thoracic - Right Rotation  mildly limited    Thoracic - Left Rotation  mildly limited                   [facility] Adult PT Treatment/Exercise - 08/24/19 0001      Lumbar Exercises: Standing   Other Standing Lumbar Exercises  thoracic rotation at wall x5 each side      Shoulder Exercises: Seated   Other Seated Exercises  R/L lat stretch in sitting using green pball 5x5"      Shoulder Exercises: Standing   Horizontal ABduction  Strengthening;Both;10 reps;Theraband    Theraband Level (Shoulder Horizontal ABduction)  Level 1 (Yellow);Level 2 (Red)    Horizontal ABduction Limitations  5x red, 5x yellow   better form with yellow TB   Row  Strengthening;Both;10 reps;Theraband    Theraband Level (Shoulder Row)  Level 2 (Red)    Row Limitations  cues for scap retraction and stopping shoulder at neutral      Shoulder Exercises: ROM/Strengthening   UBE (Upper Arm Bike)  L1.7x 26mn forward, 335m back  Shoulder Exercises: Stretch   Corner Stretch  2 reps;30 seconds    Corner Stretch Limitations  mid pec stretch in doorway    Other Shoulder Stretches  R/L lat stretch at wall- discontonued d/t confusion             PT Education - 08/24/19 1454    Education Details  update to HEP; discussion on objective progress and remaining impairments    Person(s) Educated  Patient    Methods  Explanation;Demonstration;Tactile cues;Verbal cues;Handout    Comprehension  Verbalized understanding;Returned demonstration       PT Short Term Goals - 08/24/19 1406      PT SHORT TERM GOAL #1   Title  Patient to be independent with initial HEP.    Time  3    Period  Weeks    Status  Achieved    Target Date  08/03/19        PT Long Term Goals -  08/24/19 1406      PT LONG TERM GOAL #1   Title  Patient to be independent with advanced HEP.    Time  6    Period  Weeks    Status  Achieved      PT LONG TERM GOAL #2   Title  Patient to demonstrate thoracic AROM WFL and without c/o stiffness.    Time  6    Period  Weeks    Status  Partially Met   pain improved with extension, ROM still limited     PT LONG TERM GOAL #3   Title  Patient to demonstrate and recall proper postural correction techniques at rest and with activities.    Time  6    Period  Weeks    Status  Partially Met   reporting improved awareness, but unsure if improved     PT LONG TERM GOAL #4   Title  Patient to report tolerance for 1.5 hours working in the garden without c/o pain.    Time  6    Period  Weeks    Status  Achieved            Plan - 08/24/19 1459    Clinical Impression Statement  Patient reports 50-60% improvement since initial eval. Notes that she feels comfortable with HEP and would like to wrap up with therapy today. Patient was able to spread pine needles and pull weeds over 2 days' time without an increase in midback pain recently. Thoracic AROM unchanged today, however patient was pain-free and without c/o stiffness with these motions today. Patient notes that she is more aware of her posture, but unsure if it has improved. Patient seems to be sitting more upright today but still with rounded and kyphotic posture at rest. Worked on consolidating and reviewing HEP for max benefit and carryover. Patient reported understanding of updated HEP and all questions answered. Patient is discharged at this time per her request.    Comorbidities  HTN, cervical radiculopathy, cervical DDD, L de quervain's, HLD, myofascial pain, DM II, L knee arthroscopy    Rehab Potential  Good    PT Frequency  1x / week    PT Treatment/Interventions  ADLs/Self Care Home Management;Cryotherapy;Electrical Stimulation;Iontophoresis '4mg'$ /ml Dexamethasone;Moist  Heat;Therapeutic exercise;Therapeutic activities;Functional mobility training;Ultrasound;Neuromuscular re-education;Patient/family education;Manual techniques;Taping;Energy conservation;Dry needling;Passive range of motion    PT Next Visit Plan  DC at this time    Consulted and Agree with Plan of Care  Patient       Patient will  benefit from skilled therapeutic intervention in order to improve the following deficits and impairments:  Decreased activity tolerance, Decreased strength, Increased fascial restricitons, Pain, Increased muscle spasms, Decreased range of motion, Improper body mechanics, Postural dysfunction, Impaired flexibility  Visit Diagnosis: Pain in thoracic spine  Abnormal posture  Other muscle spasm     Problem List Patient Active Problem List   Diagnosis Date Noted  . Unilateral primary osteoarthritis, left knee 07/21/2017  . Chronic pain of left knee 07/21/2017  . Status post arthroscopy of left knee 06/23/2017  . Other tear of medial meniscus, current injury, left knee, subsequent encounter 06/02/2017  . De Quervain's tenosynovitis, left 09/06/2015  . Trigger middle finger of right hand 09/06/2015  . Benign essential HTN 06/21/2015  . Mixed hyperlipidemia 06/21/2015  . Type 2 diabetes mellitus without complication ([place]) 29/19/1660  . Myofascial pain 11/24/2012  . Cervical radiculopathy 05/29/2011  . DDD (degenerative disc disease), cervical 05/29/2011     PHYSICAL THERAPY DISCHARGE SUMMARY  Visits from Start of Care: 7  Current functional level related to goals / functional outcomes: See above clinical impression   Remaining deficits: Decreased thoracic AROM, rounded and kyphotic posture   Education / Equipment: HEP  Plan: Patient agrees to discharge.  Patient goals were partially met. Patient is being discharged due to being pleased with the current functional level.  ?????     Janene Harvey, PT, DPT 08/24/19 3:01 PM   [facility] [address]  [place], [place], [address] Phone: [phone]   Fax:  [phone]  Name: Wendy Vaughn MRN: [id number] Date of Birth: 10-17-1940

## 2022-05-30 ENCOUNTER — Encounter: Payer: Self-pay | Admitting: Orthopaedic Surgery

## 2022-05-30 ENCOUNTER — Ambulatory Visit (INDEPENDENT_AMBULATORY_CARE_PROVIDER_SITE_OTHER): Payer: Medicare Other | Admitting: Orthopaedic Surgery

## 2022-05-30 DIAGNOSIS — M549 Dorsalgia, unspecified: Secondary | ICD-10-CM

## 2022-05-30 MED ORDER — LIDOCAINE HCL 1 % IJ SOLN
1.0000 mL | INTRAMUSCULAR | Status: AC | PRN
  Administered 2022-05-30: 1 mL

## 2022-05-30 MED ORDER — METHYLPREDNISOLONE ACETATE 40 MG/ML IJ SUSP
40.0000 mg | INTRAMUSCULAR | Status: AC | PRN
  Administered 2022-05-30: 40 mg via INTRAMUSCULAR

## 2022-05-30 NOTE — Progress Notes (Signed)
The patient is someone that I have seen before.  She is 82 years old and actually lives next-door to my parents.  In March 2021 I provided a trigger point injection and the thoracic spine area where she had point tenderness.  This is at the mid aspect of her thoracic spine just left of the midline.  She is heading out tomorrow on a vacation and cruise with her daughter and she would like to have a trigger point injection today.  She has had otherwise no acute change in her medical status.  On examination I can find an area of point tenderness in the thoracic spine area just left of the midline.  I was able to mark this area and then provide an injection 1 cc lidocaine and 1 cc of Depo-Medrol.  I also recommended Voltaren gel.  All questions and concerns were answered and addressed.  Follow-up can be as needed.      Procedure Note  Patient: Wendy Vaughn             Date of Birth: [birth year]           MRN: [id number]             Visit Date: 05/30/2022  Procedures: Visit Diagnoses:  1. Trigger point with back pain     Trigger Point Inj  Date/Time: 05/30/2022 4:36 PM  Performed by: Mcarthur Rossetti, MD Authorized by: Mcarthur Rossetti, MD   Total # of Trigger Points:  1 Location: back   Medications #1:  1 mL lidocaine 1 %; 40 mg methylPREDNISolone acetate 40 MG/ML

## 2022-08-29 ENCOUNTER — Encounter: Payer: Self-pay | Admitting: Physician Assistant

## 2022-08-29 ENCOUNTER — Ambulatory Visit (INDEPENDENT_AMBULATORY_CARE_PROVIDER_SITE_OTHER): Payer: Medicare Other | Admitting: Physician Assistant

## 2022-08-29 DIAGNOSIS — M549 Dorsalgia, unspecified: Secondary | ICD-10-CM | POA: Diagnosis not present

## 2022-08-29 MED ORDER — LIDOCAINE HCL 1 % IJ SOLN
1.0000 mL | INTRAMUSCULAR | Status: AC | PRN
  Administered 2022-08-29: 1 mL

## 2022-08-29 MED ORDER — METHYLPREDNISOLONE ACETATE 40 MG/ML IJ SUSP
40.0000 mg | INTRAMUSCULAR | Status: AC | PRN
  Administered 2022-08-29: 40 mg via INTRAMUSCULAR

## 2022-08-29 NOTE — Progress Notes (Signed)
   Procedure Note  Patient: Wendy Vaughn             Date of Birth: [birth year]           MRN: [id number]             Visit Date: 08/29/2022 HPI: Patient 82 year old female well-known to Dr. Magnus Ivan service.  She comes in today for trigger point pain in her back.  She reports she is going to [place] soon and wants an injection on the left side prior to going to [place].  She states last injection on 05/30/2022 gave her good relief.  She has had no real change in the overall pain. Review of systems: Negative for fevers chills.  Physical exam: Thoracic: Lower thoracic region just lateral to the midline on the left there is an area of point tenderness.  No edema or erythema.  Procedures: Visit Diagnoses:  1. Trigger point with back pain     Trigger Point Inj  Date/Time: 08/29/2022 3:12 PM  Performed by: Kirtland Bouchard, PA-C Authorized by: Kirtland Bouchard, PA-C   Indications:  Pain and therapeutic Total # of Trigger Points:  1 Location: back   Needle Size:  25 G Approach:  Dorsal Medications #1:  1 mL lidocaine 1 %; 40 mg methylPREDNISolone acetate 40 MG/ML   Plan: She will follow-up as needed.  Questions were encouraged and answered she tolerated injection well today.

## 2022-10-15 ENCOUNTER — Ambulatory Visit (INDEPENDENT_AMBULATORY_CARE_PROVIDER_SITE_OTHER): Payer: Medicare Other | Admitting: Physical Medicine and Rehabilitation

## 2022-10-15 ENCOUNTER — Encounter: Payer: Self-pay | Admitting: Physical Medicine and Rehabilitation

## 2022-10-15 DIAGNOSIS — S39012A Strain of muscle, fascia and tendon of lower back, initial encounter: Secondary | ICD-10-CM | POA: Diagnosis not present

## 2022-10-15 DIAGNOSIS — M5416 Radiculopathy, lumbar region: Secondary | ICD-10-CM | POA: Diagnosis not present

## 2022-10-15 DIAGNOSIS — G8929 Other chronic pain: Secondary | ICD-10-CM

## 2022-10-15 DIAGNOSIS — M7918 Myalgia, other site: Secondary | ICD-10-CM

## 2022-10-15 DIAGNOSIS — M5441 Lumbago with sciatica, right side: Secondary | ICD-10-CM | POA: Diagnosis not present

## 2022-10-15 NOTE — Progress Notes (Signed)
Functional Pain Scale - descriptive words and definitions  Unmanageable (7)  Pain interferes with normal ADL's/nothing seems to help/sleep is very difficult/active distractions are very difficult to concentrate on. Severe range order  Average Pain 7-9  Lower back pain on right that radiates into right hip and down the outer part of the leg to the calf. Started 10/10/22 or 10/11/22 when she bent over

## 2022-10-15 NOTE — Progress Notes (Signed)
Wendy Vaughn - 82 y.o. female MRN [id number]  Date of birth: 09-18-1940  Office Visit Note: Visit Date: 10/15/2022 PCP: Leola Brazil, DO Referred by: Leola Brazil, DO  Subjective: Chief Complaint  Patient presents with   Lower Back - Pain   HPI: Wendy Vaughn is a 82 y.o. female who comes in today for evaluation of acute on chronic right sided lower back pain radiating to buttock and down lateral leg to calf. Long history of chronic back issues. She is patient of Dr. Rudi Heap, PA. States current discomfort feels considerably different than previous exacerbations.  Pain started on Friday 10/11/2022 after bending over. Pain worsens with sitting and laying down. She describes pain as sore and throbbing sensation, currently rates as 8 out of 10. Some relief of pain with home exercise regimen, rest and use of over the counter medications. History of formal physical therapy with some relief of pain. Lumbar MRI imaging from 2015 exhibits multi level spondylosis causing mild impingement at  L3-4, L4-5, and L5-S1. No history of lumbar surgery/injections. History of multiple thoracic trigger point injections with Dr. Temple Pacini. Good relief of pain with these injections. Patient denies focal weakness, numbness and tingling. No recent trauma or falls.    Review of Systems  Musculoskeletal:  Positive for back pain.  Neurological:  Negative for tingling, sensory change, focal weakness and weakness.  All other systems reviewed and are negative.  Otherwise per HPI.  Assessment & Plan: Visit Diagnoses:    ICD-10-CM   1. Strain of lumbar region, initial encounter  S39.012A Muscle Tender Injection to Right Quadratus Lumborum    2. Myofascial pain syndrome  M79.18 Muscle Tender Injection to Right Quadratus Lumborum    MR LUMBAR SPINE WO CONTRAST    3. Chronic bilateral low back pain with right-sided sciatica  M54.41 MR LUMBAR SPINE WO CONTRAST   G89.29     4. Lumbar  radiculopathy  M54.16 MR LUMBAR SPINE WO CONTRAST       Plan: Findings:  Acute on chronic right sided lower back pain radiating to buttock and down lateral leg to calf. Patient continues to have severe pain despite good conservative therapies such as formal physical therapy, home exercise regimen, rest and use of medications. Patients clinical presentation and exam are consistent with lumbar strain/myofascial pain syndrome, Localized area of tenderness to right quadratus lumborum upon palpation today. Other differentials include lumbar radiculopathy, her pain distribution does correlate with L5 nerve pattern. I performed muscle tendon injection to right quadratus lumborum region in the office today, she tolerated without difficulty. I encouraged patient to remain active, can continue with heating pad and OTC medications as needed. I also discussed obtaining new lumbar MRI imaging, prior imaging is from 2015, given her symptoms seem very different I did go ahead and place order today. I will have patient follow up for lumbar MRI review and to discuss treatment options. We would consider performing lumbar epidural steroid injection if warranted. No red flag symptoms noted upon exam today.     Meds & Orders: No orders of the defined types were placed in this encounter.   Orders Placed This Encounter  Procedures   Muscle Tender Injection to Right Quadratus Lumborum   MR LUMBAR SPINE WO CONTRAST    Follow-up: Return for follow up for lumbar MRI review.   Procedures: Muscle Tender Injection to Right Quadratus Lumborum  Date/Time: 10/15/2022 1:59 PM  Performed by: Juanda Chance, NP Authorized by: Juanda Chance,  NP   Consent Given by:  Patient Site marked: the procedure site was marked   Timeout: prior to procedure the correct patient, procedure, and site was verified   Indications:  Pain Total # of Trigger Points:  1 Location: back   Needle Size:  25 G Approach:  Dorsal Medications #1:   40 mg triamcinolone acetonide 40 MG/ML; 30 mg ketorolac 30 MG/ML Patient tolerance:  Patient tolerated the procedure well with no immediate complications Comments: Muscle tendon injection to right quadratus lumborum region. Patient tolerated well.        Clinical History: No specialty comments available.   She reports that she has never smoked. She has never used smokeless tobacco. No results for input(s): "HGBA1C", "LABURIC" in the last 8760 hours.  Objective:  VS:  HT:    WT:   BMI:     BP:   HR: bpm  TEMP: ( )  RESP:  Physical Exam Vitals and nursing note reviewed.  HENT:     Head: Normocephalic and atraumatic.     Right Ear: External ear normal.     Left Ear: External ear normal.     Nose: Nose normal.     Mouth/Throat:     Mouth: Mucous membranes are moist.  Eyes:     Extraocular Movements: Extraocular movements intact.  Cardiovascular:     Rate and Rhythm: Normal rate.     Pulses: Normal pulses.  Pulmonary:     Effort: Pulmonary effort is normal.  Abdominal:     General: Abdomen is flat. There is no distension.  Musculoskeletal:        General: Tenderness present.     Cervical back: Normal range of motion.     Comments: Patient is slow to rise from seated position to standing. Good lumbar range of motion. No pain noted with facet loading. 5/5 strength noted with bilateral hip flexion, knee flexion/extension, ankle dorsiflexion/plantarflexion and EHL. No clonus noted bilaterally. No pain upon palpation of greater trochanters. No pain with internal/external rotation of bilateral hips. Sensation intact bilaterally. Tenderness noted upon palpation of right quadratus lumborum region. Negative slump test bilaterally. Ambulates without aid, gait steady.     Skin:    General: Skin is warm and dry.     Capillary Refill: Capillary refill takes less than 2 seconds.  Neurological:     General: No focal deficit present.     Mental Status: She is alert and oriented to person,  place, and time.  Psychiatric:        Mood and Affect: Mood normal.        Behavior: Behavior normal.     Ortho Exam  Imaging: No results found.  Past Medical/Family/Surgical/Social History: Medications & Allergies reviewed per EMR, new medications updated. Patient Active Problem List   Diagnosis Date Noted   Unilateral primary osteoarthritis, left knee 07/21/2017   Chronic pain of left knee 07/21/2017   Status post arthroscopy of left knee 06/23/2017   Other tear of medial meniscus, current injury, left knee, subsequent encounter 06/02/2017   De Quervain's tenosynovitis, left 09/06/2015   Trigger middle finger of right hand 09/06/2015   Benign essential HTN 06/21/2015   Mixed hyperlipidemia 06/21/2015   Type 2 diabetes mellitus without complication (HCC) 06/21/2015   Myofascial pain 11/24/2012   Cervical radiculopathy 05/29/2011   DDD (degenerative disc disease), cervical 05/29/2011   History reviewed. No pertinent past medical history. History reviewed. No pertinent family history. History reviewed. No pertinent surgical history. Social History  Occupational History   Not on file  Tobacco Use   Smoking status: Never   Smokeless tobacco: Never  Substance and Sexual Activity   Alcohol use: Not on file   Drug use: Not on file   Sexual activity: Not on file

## 2022-10-17 ENCOUNTER — Telehealth: Payer: Self-pay | Admitting: Physical Medicine and Rehabilitation

## 2022-10-17 ENCOUNTER — Other Ambulatory Visit: Payer: Self-pay | Admitting: Physical Medicine and Rehabilitation

## 2022-10-17 MED ORDER — TRAMADOL HCL 50 MG PO TABS: 50.0000 mg | ORAL_TABLET | Freq: Three times a day (TID) | ORAL | 0 refills | Status: DC | PRN

## 2022-10-17 NOTE — Telephone Encounter (Signed)
Patient said she came in to see you the other day. Was told if her pain worsened to call back. She is in a lot of pain and would like to know what to do.

## 2022-10-18 ENCOUNTER — Telehealth: Payer: Self-pay | Admitting: Radiology

## 2022-10-18 MED ORDER — TRIAMCINOLONE ACETONIDE 40 MG/ML IJ SUSP
40.0000 mg | INTRAMUSCULAR | Status: AC | PRN
Start: 2022-10-15 — End: 2022-10-15
  Administered 2022-10-15: 40 mg via INTRAMUSCULAR

## 2022-10-18 MED ORDER — KETOROLAC TROMETHAMINE 30 MG/ML IJ SOLN
30.0000 mg | INTRAMUSCULAR | Status: AC | PRN
Start: 2022-10-15 — End: 2022-10-15
  Administered 2022-10-15: 30 mg via INTRA_ARTICULAR

## 2022-10-18 NOTE — Telephone Encounter (Signed)
Spoke with patient and she wanted an MRI of her right leg. She is having right side lower back pain and I explained that her pain may be coming from her back. I also informed her that since we have not addressed any pain in her leg, insurance may not cover it. Informed patient to continue Tramadol and once we get the MRI, we will let her know what it shows

## 2022-10-21 ENCOUNTER — Ambulatory Visit
Admission: RE | Admit: 2022-10-21 | Discharge: 2022-10-21 | Disposition: A | Discharge status: Home / Self Care | Payer: Medicare Other | Source: Ambulatory Visit | Attending: Physical Medicine and Rehabilitation | Admitting: Physical Medicine and Rehabilitation

## 2022-10-21 DIAGNOSIS — G8929 Other chronic pain: Secondary | ICD-10-CM

## 2022-10-21 DIAGNOSIS — M7918 Myalgia, other site: Secondary | ICD-10-CM

## 2022-10-21 DIAGNOSIS — M5416 Radiculopathy, lumbar region: Secondary | ICD-10-CM

## 2022-10-24 ENCOUNTER — Telehealth: Payer: Self-pay

## 2022-10-24 NOTE — Telephone Encounter (Signed)
Patient called triage. She had her MRI and wants to know the results. Call back #[phone]

## 2022-10-25 NOTE — Telephone Encounter (Signed)
Left detailed office visit explaining that once we receive the results she will get a call

## 2022-10-27 ENCOUNTER — Other Ambulatory Visit: Payer: Medicare Other

## 2022-10-28 ENCOUNTER — Telehealth: Payer: Self-pay | Admitting: Physical Medicine and Rehabilitation

## 2022-10-28 ENCOUNTER — Other Ambulatory Visit: Payer: Self-pay | Admitting: Physical Medicine and Rehabilitation

## 2022-10-28 DIAGNOSIS — M5416 Radiculopathy, lumbar region: Secondary | ICD-10-CM

## 2022-10-28 MED ORDER — TRAMADOL HCL 50 MG PO TABS: 50.0000 mg | ORAL_TABLET | Freq: Four times a day (QID) | ORAL | 0 refills | Status: DC | PRN

## 2022-10-28 NOTE — Telephone Encounter (Signed)
Spoke with patient this morning, read is not back on recent lumbar MRI imaging, Dr. Alvester Morin and myself reviewed imaging, there is right sided paracentral disc protrusion at L3-L4. I did place order for lumbar epidural steroid injection. I also refilled Tramadol. We will call to schedule injection.

## 2022-10-28 NOTE — Telephone Encounter (Signed)
Patient called advised she is out of her pain medication and is in a lot of pain. Patient said she had the MRI but have not heard back because the imaging facility is behind.  Patient asked for a call back as soon as possible. The number to contact patient is [phone].

## 2022-11-04 ENCOUNTER — Other Ambulatory Visit: Payer: Self-pay | Admitting: Physical Medicine and Rehabilitation

## 2022-11-04 DIAGNOSIS — M5416 Radiculopathy, lumbar region: Secondary | ICD-10-CM

## 2022-11-05 ENCOUNTER — Ambulatory Visit
Admission: RE | Admit: 2022-11-05 | Discharge: 2022-11-05 | Disposition: A | Discharge status: Home / Self Care | Payer: Medicare Other | Source: Ambulatory Visit | Attending: Physical Medicine and Rehabilitation | Admitting: Physical Medicine and Rehabilitation

## 2022-11-05 DIAGNOSIS — M5416 Radiculopathy, lumbar region: Secondary | ICD-10-CM

## 2022-11-05 MED ORDER — IOPAMIDOL (ISOVUE-M 200) INJECTION 41%
1.0000 mL | Freq: Once | INTRAMUSCULAR | Status: AC
  Administered 2022-11-05: 1 mL via EPIDURAL

## 2022-11-05 MED ORDER — METHYLPREDNISOLONE ACETATE 40 MG/ML INJ SUSP (RADIOLOG
80.0000 mg | Freq: Once | INTRAMUSCULAR | Status: AC
  Administered 2022-11-05: 80 mg via EPIDURAL

## 2022-11-05 NOTE — Discharge Instructions (Signed)

## 2022-11-11 ENCOUNTER — Telehealth: Payer: Self-pay | Admitting: Physical Medicine and Rehabilitation

## 2022-11-11 NOTE — Telephone Encounter (Signed)
Pt called for a follow up appt with Dr Alvester Morin form injection. Please call pt at [phone].

## 2022-11-18 ENCOUNTER — Telehealth: Payer: Self-pay | Admitting: Physical Medicine and Rehabilitation

## 2022-11-18 NOTE — Telephone Encounter (Signed)
Pt called requesting a call as son as possible to set an appt. Pt states she called on 7/1. Please call pt to set an appt with Dr Alvester Morin at phone number [phone].

## 2022-11-19 NOTE — Telephone Encounter (Signed)
See previous encounter

## 2022-11-19 NOTE — Telephone Encounter (Signed)
LVM to return call to schedule follow up OV

## 2022-11-25 ENCOUNTER — Ambulatory Visit (INDEPENDENT_AMBULATORY_CARE_PROVIDER_SITE_OTHER): Payer: Medicare Other | Admitting: Physical Medicine and Rehabilitation

## 2022-11-25 DIAGNOSIS — M5116 Intervertebral disc disorders with radiculopathy, lumbar region: Secondary | ICD-10-CM

## 2022-11-25 DIAGNOSIS — M5416 Radiculopathy, lumbar region: Secondary | ICD-10-CM

## 2022-11-25 DIAGNOSIS — M47816 Spondylosis without myelopathy or radiculopathy, lumbar region: Secondary | ICD-10-CM

## 2022-11-25 NOTE — Progress Notes (Unsigned)
Follow up from back injection- everything still hurts after injection- had fall the same day of injection.  Pain scale is a 7 but moves. Having hard time adjusting to pain.

## 2022-11-25 NOTE — Progress Notes (Signed)
Wendy Vaughn - 82 y.o. female MRN [id number]  Date of birth: 1941-02-28  Office Visit Note: Visit Date: 11/25/2022 PCP: Leola Brazil, DO Referred by: Leola Brazil, DO  Subjective: Chief Complaint  Patient presents with   Lower Back - Pain   HPI: Wendy Vaughn is a 82 y.o. female who comes in today for evaluation of chronic, worsening and severe right sided lower back pain radiating to right anterolateral leg to foot. Pain ongoing for several weeks. Pain worsens with sitting and laying down. She describes pain as sore and aching, currently rates as 7 out of 10. Some relief of pain with formal physical therapy, home exercise regimen, rest and use of medications. Recent lumbar MRI imaging exhibits right paracentral extrusion at L3-L4 causing mild to moderate spinal canal stenosis, there is also left paracentral disc protrusion at L5-S1. After our previous office visit I placed order for patient to have injection in our office, however she was unable to wait for insurance approval and requested injection be performed at [facility]. She underwent right L3-L4 interlaminar epidural steroid injection at [facility] on 11/05/2022, she reports 100% relief of pain for 2-3 days following injection. States her pain gradually returned several weeks post injection. Patient is here today to discuss treatment options. Patient denies focal weakness, numbness and tingling. No recent trauma or falls.    Review of Systems  Musculoskeletal:  Positive for back pain.  Neurological:  Negative for tingling, sensory change, focal weakness and weakness.  All other systems reviewed and are negative.  Otherwise per HPI.  Assessment & Plan: Visit Diagnoses:    ICD-10-CM   1. Lumbar radiculopathy  M54.16 Ambulatory referral to Physical Medicine Rehab    2. Intervertebral disc disorders with radiculopathy, lumbar region  M51.16 Ambulatory referral to Physical Medicine Rehab    3. Facet arthropathy, lumbar  M47.816  Ambulatory referral to Physical Medicine Rehab       Plan: Findings:  Chronic, worsening and severe right sided lower back pain radiating to right anterolateral leg to foot. Patient continues to have severe pain despite good conservative therapies such as formal physical therapy, home exercise regimen, rest and use of medications. Patients clinical presentation and exam are consistent with L5 nerve pattern. There is right paracentral disc extrusion at L3-L4, left paracentral protrusion at L5-S1. Next step is to perform diagnostic and hopefully therapeutic right L4-L5 interlaminar epidural steroid injection under fluoroscopic guidance. She is not currently taking anticoagulants. If good relief of pain we can repeat injection infrequently as needed. If her pain continues we did discuss possible surgical consultation. I encouraged patient to remain active as tolerated. No red flag symptoms noted upon exam today.     Meds & Orders: No orders of the defined types were placed in this encounter.   Orders Placed This Encounter  Procedures   Ambulatory referral to Physical Medicine Rehab    Follow-up: Return for Right L4-L5 interlaminar epidural steroid injection.   Procedures: No procedures performed      Clinical History: CLINICAL DATA:  Low back pain radiating down the right hip and leg for over 6 weeks.   EXAM: MRI LUMBAR SPINE WITHOUT CONTRAST   TECHNIQUE: Multiplanar, multisequence MR imaging of the lumbar spine was performed. No intravenous contrast was administered.   COMPARISON:  09/03/2013   FINDINGS: Segmentation:  Standard.   Alignment:  Slight retrolisthesis at L3-4   Vertebrae:  No fracture, evidence of discitis, or bone lesion.   Conus medullaris and  cauda equina: Conus extends to the L1 level. Conus and cauda equina appear normal.   Paraspinal and other soft tissues: Negative for perispinal mass or inflammation. Mild left renal atrophy.   Disc levels:   T12- L1:  Mild disc height loss and bulging   L1-L2: Disc height loss with ventral spondylitic spurring.   L2-L3: Disc space narrowing with circumferential bulging and mild facet spurring   L3-L4: Circumferential disc bulging. Right paracentral downward migrating extrusion impinging on the right L4 nerve root. Central protrusion causing mild to moderate midline thecal sac stenosis. Mild degenerative facet spurring. The foramina are patent   L4-L5: Disc bulging with biforaminal protrusion. Mild facet spurring. Mild triangular narrowing.   L5-S1:Disc narrowing and bulging with left paracentral protrusion and ridging. Degenerative facet spurring asymmetric to the left. Left subarticular recess stenosis that could affect the left S1 nerve root.   IMPRESSION: 1. L3-4 right paracentral extrusion impinging on the right L4 nerve root. 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 2. L5-S1 left subarticular recess stenosis and S1 impingement.     Electronically Signed   By: Tiburcio Pea M.D.   On: 10/31/2022 07:27   She reports that she has never smoked. She has never used smokeless tobacco. No results for input(s): "HGBA1C", "LABURIC" in the last 8760 hours.  Objective:  VS:  HT:    WT:   BMI:     BP:   HR: bpm  TEMP: ( )  RESP:  Physical Exam Vitals and nursing note reviewed.  HENT:     Head: Normocephalic and atraumatic.     Right Ear: External ear normal.     Left Ear: External ear normal.     Nose: Nose normal.     Mouth/Throat:     Mouth: Mucous membranes are moist.  Eyes:     Extraocular Movements: Extraocular movements intact.  Cardiovascular:     Rate and Rhythm: Normal rate.     Pulses: Normal pulses.  Pulmonary:     Effort: Pulmonary effort is normal.  Abdominal:     General: Abdomen is flat. There is no distension.  Musculoskeletal:        General: Tenderness present.     Cervical back: Normal range of motion.      Comments: Patient is slow to rise from seated position to standing. Good lumbar range of motion. No pain noted with facet loading. 5/5 strength noted with bilateral hip flexion, knee flexion/extension, ankle dorsiflexion/plantarflexion and EHL. No clonus noted bilaterally. No pain upon palpation of greater trochanters. No pain with internal/external rotation of bilateral hips. Sensation intact bilaterally. Dysesthesias noted to right L5 dermatome. Positive slump test on the right. Ambulates without aid, gait steady.     Skin:    General: Skin is warm and dry.     Capillary Refill: Capillary refill takes less than 2 seconds.  Neurological:     General: No focal deficit present.     Mental Status: She is alert.  Psychiatric:        Mood and Affect: Mood normal.        Behavior: Behavior normal.     Ortho Exam  Imaging: No results found.  Past Medical/Family/Surgical/Social History: Medications & Allergies reviewed per EMR, new medications updated. Patient Active Problem List   Diagnosis Date Noted   Unilateral primary osteoarthritis, left knee 07/21/2017   Chronic pain of left knee 07/21/2017   Status post arthroscopy of left knee  06/23/2017   Other tear of medial meniscus, current injury, left knee, subsequent encounter 06/02/2017   De Quervain's tenosynovitis, left 09/06/2015   Trigger middle finger of right hand 09/06/2015   Benign essential HTN 06/21/2015   Mixed hyperlipidemia 06/21/2015   Type 2 diabetes mellitus without complication (HCC) 06/21/2015   Myofascial pain 11/24/2012   Cervical radiculopathy 05/29/2011   DDD (degenerative disc disease), cervical 05/29/2011   No past medical history on file. No family history on file. No past surgical history on file. Social History   Occupational History   Not on file  Tobacco Use   Smoking status: Never   Smokeless tobacco: Never  Substance and Sexual Activity   Alcohol use: Not on file   Drug use: Not on file   Sexual  activity: Not on file

## 2022-11-26 ENCOUNTER — Encounter: Payer: Self-pay | Admitting: Physical Medicine and Rehabilitation

## 2022-12-30 ENCOUNTER — Other Ambulatory Visit: Payer: Self-pay

## 2022-12-30 ENCOUNTER — Ambulatory Visit (INDEPENDENT_AMBULATORY_CARE_PROVIDER_SITE_OTHER): Payer: Medicare Other | Admitting: Physical Medicine and Rehabilitation

## 2022-12-30 VITALS — BP 143/92 | HR 67

## 2022-12-30 DIAGNOSIS — M5416 Radiculopathy, lumbar region: Secondary | ICD-10-CM

## 2022-12-30 MED ORDER — METHYLPREDNISOLONE ACETATE 80 MG/ML IJ SUSP
80.0000 mg | Freq: Once | INTRAMUSCULAR | Status: AC
Start: 2022-12-30 — End: 2022-12-30
  Administered 2022-12-30: 80 mg

## 2022-12-30 MED ORDER — TRAMADOL HCL 50 MG PO TABS: 50.0000 mg | ORAL_TABLET | Freq: Three times a day (TID) | ORAL | 0 refills | Status: DC | PRN

## 2022-12-30 NOTE — Patient Instructions (Signed)

## 2022-12-30 NOTE — Progress Notes (Signed)
Functional Pain Scale - descriptive words and definitions  Distracting (5)    Aware of pain/able to complete some ADL's but limited by pain/sleep is affected and active distractions are only slightly useful. Moderate range order  Average Pain  varies on the day   +Driver, -BT, -Dye Allergies.  Lower back pain on right side that radiates in the legs. The left side radiates into the left leg to the ankle

## 2023-01-09 ENCOUNTER — Telehealth: Payer: Self-pay | Admitting: Physical Medicine and Rehabilitation

## 2023-01-09 ENCOUNTER — Other Ambulatory Visit: Payer: Self-pay | Admitting: Physical Medicine and Rehabilitation

## 2023-01-09 DIAGNOSIS — M5416 Radiculopathy, lumbar region: Secondary | ICD-10-CM

## 2023-01-09 MED ORDER — DIAZEPAM 5 MG PO TABS: ORAL_TABLET | ORAL | 0 refills | Status: DC

## 2023-01-09 NOTE — Telephone Encounter (Signed)
Patient called. Says she is still having pain. Her cb# [phone]

## 2023-01-11 NOTE — Procedures (Signed)
Lumbosacral Transforaminal Epidural Steroid Injection - Sub-Pedicular Approach with Fluoroscopic Guidance  Patient: Wendy Vaughn      Date of Birth: [birth year] MRN: [id number] PCP: Leola Brazil, DO      Visit Date: 12/30/2022   Universal Protocol:    Date/Time: 12/30/2022  Consent Given By: the patient  Position: PRONE  Additional Comments: Vital signs were monitored before and after the procedure. Patient was prepped and draped in the usual sterile fashion. The correct patient, procedure, and site was verified.   Injection Procedure Details:   Procedure diagnoses: Lumbar radiculopathy [M54.16]    Meds Administered:  Meds ordered this encounter  Medications   methylPREDNISolone acetate (DEPO-MEDROL) injection 80 mg   traMADol (ULTRAM) 50 MG tablet    Sig: Take 1 tablet (50 mg total) by mouth every 8 (eight) hours as needed.    Dispense:  20 tablet    Refill:  0    Laterality: Right  Location/Site: L4  Needle:5.0 in., 22 ga.  Short bevel or Quincke spinal needle  Needle Placement: Transforaminal  Findings:    -Comments: Excellent flow of contrast along the nerve, nerve root and into the epidural space.  Procedure Details: After squaring off the end-plates to get a true AP view, the C-arm was positioned so that an oblique view of the foramen as noted above was visualized. The target area is just inferior to the "nose of the scotty dog" or sub pedicular. The soft tissues overlying this structure were infiltrated with 2-3 ml. of 1% Lidocaine without Epinephrine.  The spinal needle was inserted toward the target using a "trajectory" view along the fluoroscope beam.  Under AP and lateral visualization, the needle was advanced so it did not puncture dura and was located close the 6 O'Clock position of the pedical in AP tracterory. Biplanar projections were used to confirm position. Aspiration was confirmed to be negative for CSF and/or blood. A 1-2 ml. volume of Isovue-250  was injected and flow of contrast was noted at each level. Radiographs were obtained for documentation purposes.   After attaining the desired flow of contrast documented above, a 0.5 to 1.0 ml test dose of 0.25% Marcaine was injected into each respective transforaminal space.  The patient was observed for 90 seconds post injection.  After no sensory deficits were reported, and normal lower extremity motor function was noted,   the above injectate was administered so that equal amounts of the injectate were placed at each foramen (level) into the transforaminal epidural space.   Additional Comments:  No complications occurred Dressing: 2 x 2 sterile gauze and Band-Aid    Post-procedure details: Patient was observed during the procedure. Post-procedure instructions were reviewed.  Patient left the clinic in stable condition.

## 2023-01-11 NOTE — Progress Notes (Signed)
Wendy Vaughn - 82 y.o. female MRN [id number]  Date of birth: 11-Nov-1940  Office Visit Note: Visit Date: 12/30/2022 PCP: Leola Brazil, DO Referred by: Leola Brazil, DO  Subjective: Chief Complaint  Patient presents with   Lower Back - Pain   HPI:  Wendy Vaughn is a 82 y.o. female who comes in today at the request of Ellin Goodie, FNP for planned Right L4-5 Lumbar Transforaminal epidural steroid injection with fluoroscopic guidance.  The patient has failed conservative care including home exercise, medications, time and activity modification.  This injection will be diagnostic and hopefully therapeutic.  Please see requesting physician notes for further details and justification.   ROS Otherwise per HPI.  Assessment & Plan: Visit Diagnoses:    ICD-10-CM   1. Lumbar radiculopathy  M54.16 XR C-ARM NO REPORT    Epidural Steroid injection    methylPREDNISolone acetate (DEPO-MEDROL) injection 80 mg      Plan: No additional findings.   Meds & Orders:  Meds ordered this encounter  Medications   methylPREDNISolone acetate (DEPO-MEDROL) injection 80 mg   traMADol (ULTRAM) 50 MG tablet    Sig: Take 1 tablet (50 mg total) by mouth every 8 (eight) hours as needed.    Dispense:  20 tablet    Refill:  0    Orders Placed This Encounter  Procedures   XR C-ARM NO REPORT   Epidural Steroid injection    Follow-up: Return for visit to requesting provider as needed.   Procedures: No procedures performed  Lumbosacral Transforaminal Epidural Steroid Injection - Sub-Pedicular Approach with Fluoroscopic Guidance  Patient: Wendy Vaughn      Date of Birth: [birth year] MRN: [id number] PCP: Leola Brazil, DO      Visit Date: 12/30/2022   Universal Protocol:    Date/Time: 12/30/2022  Consent Given By: the patient  Position: PRONE  Additional Comments: Vital signs were monitored before and after the procedure. Patient was prepped and draped in the usual sterile fashion. The  correct patient, procedure, and site was verified.   Injection Procedure Details:   Procedure diagnoses: Lumbar radiculopathy [M54.16]    Meds Administered:  Meds ordered this encounter  Medications   methylPREDNISolone acetate (DEPO-MEDROL) injection 80 mg   traMADol (ULTRAM) 50 MG tablet    Sig: Take 1 tablet (50 mg total) by mouth every 8 (eight) hours as needed.    Dispense:  20 tablet    Refill:  0    Laterality: Right  Location/Site: L4  Needle:5.0 in., 22 ga.  Short bevel or Quincke spinal needle  Needle Placement: Transforaminal  Findings:    -Comments: Excellent flow of contrast along the nerve, nerve root and into the epidural space.  Procedure Details: After squaring off the end-plates to get a true AP view, the C-arm was positioned so that an oblique view of the foramen as noted above was visualized. The target area is just inferior to the "nose of the scotty dog" or sub pedicular. The soft tissues overlying this structure were infiltrated with 2-3 ml. of 1% Lidocaine without Epinephrine.  The spinal needle was inserted toward the target using a "trajectory" view along the fluoroscope beam.  Under AP and lateral visualization, the needle was advanced so it did not puncture dura and was located close the 6 O'Clock position of the pedical in AP tracterory. Biplanar projections were used to confirm position. Aspiration was confirmed to be negative for CSF and/or blood. A 1-2 ml. volume of Isovue-250 was injected  and flow of contrast was noted at each level. Radiographs were obtained for documentation purposes.   After attaining the desired flow of contrast documented above, a 0.5 to 1.0 ml test dose of 0.25% Marcaine was injected into each respective transforaminal space.  The patient was observed for 90 seconds post injection.  After no sensory deficits were reported, and normal lower extremity motor function was noted,   the above injectate was administered so that equal  amounts of the injectate were placed at each foramen (level) into the transforaminal epidural space.   Additional Comments:  No complications occurred Dressing: 2 x 2 sterile gauze and Band-Aid    Post-procedure details: Patient was observed during the procedure. Post-procedure instructions were reviewed.  Patient left the clinic in stable condition.    Clinical History: CLINICAL DATA:  Low back pain radiating down the right hip and leg for over 6 weeks.   EXAM: MRI LUMBAR SPINE WITHOUT CONTRAST   TECHNIQUE: Multiplanar, multisequence MR imaging of the lumbar spine was performed. No intravenous contrast was administered.   COMPARISON:  09/03/2013   FINDINGS: Segmentation:  Standard.   Alignment:  Slight retrolisthesis at L3-4   Vertebrae:  No fracture, evidence of discitis, or bone lesion.   Conus medullaris and cauda equina: Conus extends to the L1 level. Conus and cauda equina appear normal.   Paraspinal and other soft tissues: Negative for perispinal mass or inflammation. Mild left renal atrophy.   Disc levels:   T12- L1: Mild disc height loss and bulging   L1-L2: Disc height loss with ventral spondylitic spurring.   L2-L3: Disc space narrowing with circumferential bulging and mild facet spurring   L3-L4: Circumferential disc bulging. Right paracentral downward migrating extrusion impinging on the right L4 nerve root. Central protrusion causing mild to moderate midline thecal sac stenosis. Mild degenerative facet spurring. The foramina are patent   L4-L5: Disc bulging with biforaminal protrusion. Mild facet spurring. Mild triangular narrowing.   L5-S1:Disc narrowing and bulging with left paracentral protrusion and ridging. Degenerative facet spurring asymmetric to the left. Left subarticular recess stenosis that could affect the left S1 nerve root.   IMPRESSION: 1. L3-4 right paracentral extrusion impinging on the right L4 nerve root. 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 2. L5-S1 left subarticular recess stenosis and S1 impingement.     Electronically Signed   By: Tiburcio Pea M.D.   On: 10/31/2022 07:27     Objective:  VS:  HT:    WT:   BMI:     BP:(!) 143/92  HR:67bpm  TEMP: ( )  RESP:  Physical Exam Vitals and nursing note reviewed.  Constitutional:      General: She is not in acute distress.    Appearance: Normal appearance. She is not ill-appearing.  HENT:     Head: Normocephalic and atraumatic.     Right Ear: External ear normal.     Left Ear: External ear normal.  Eyes:     Extraocular Movements: Extraocular movements intact.  Cardiovascular:     Rate and Rhythm: Normal rate.     Pulses: Normal pulses.  Pulmonary:     Effort: Pulmonary effort is normal. No respiratory distress.  Abdominal:     General: There is no distension.     Palpations: Abdomen is soft.  Musculoskeletal:        General: Tenderness present.     Cervical back: Neck supple.     Right  lower leg: No edema.     Left lower leg: No edema.     Comments: Patient has good distal strength with no pain over the greater trochanters.  No clonus or focal weakness.  Skin:    Findings: No erythema, lesion or rash.  Neurological:     General: No focal deficit present.     Mental Status: She is alert and oriented to person, place, and time.     Sensory: No sensory deficit.     Motor: No weakness or abnormal muscle tone.     Coordination: Coordination normal.  Psychiatric:        Mood and Affect: Mood normal.        Behavior: Behavior normal.      Imaging: No results found.

## 2023-01-27 ENCOUNTER — Other Ambulatory Visit: Payer: Self-pay

## 2023-01-27 ENCOUNTER — Ambulatory Visit (INDEPENDENT_AMBULATORY_CARE_PROVIDER_SITE_OTHER): Payer: Medicare Other | Admitting: Physical Medicine and Rehabilitation

## 2023-01-27 DIAGNOSIS — M5116 Intervertebral disc disorders with radiculopathy, lumbar region: Secondary | ICD-10-CM

## 2023-01-27 DIAGNOSIS — M5416 Radiculopathy, lumbar region: Secondary | ICD-10-CM

## 2023-01-27 DIAGNOSIS — R269 Unspecified abnormalities of gait and mobility: Secondary | ICD-10-CM | POA: Diagnosis not present

## 2023-01-27 MED ORDER — METHYLPREDNISOLONE ACETATE 80 MG/ML IJ SUSP
80.0000 mg | Freq: Once | INTRAMUSCULAR | Status: AC
Start: 2023-01-27 — End: 2023-01-27
  Administered 2023-01-27: 80 mg

## 2023-01-27 NOTE — Progress Notes (Unsigned)
Functional Pain Scale - descriptive words and definitions  Mild (2)   Noticeable when not distracted/no impact on ADL's/sleep only slightly affected and able to   use both passive and active distraction for comfort. Mild range order  Average Pain 1  A few min ago pain shot down her leg and about a 4 or 5 pain pain now comes and goes   +Driver, -BT, -Dye Allergies.

## 2023-01-27 NOTE — Patient Instructions (Signed)
[facility] Physiatry Discharge Instructions  *At any time if you have questions or concerns they can be answered by calling [phone]  All Patients: You may experience an increase in your symptoms for the first 2 days (it can take 2 days to 2 weeks for the steroid/cortisone to have its maximal effect). You may use ice to the site for the first 24 hours; 20 minutes on and 20 minutes off and may use heat after that time. You may resume and continue your current pain medications. If you need a refill please contact the prescribing physician. You may resume your medications if any were stopped for the procedure. You may shower but no swimming, tub bath or Jacuzzi for 24 hours. Please remove bandage after 4 hours. You may resume light activities as tolerated. If you had Spine Injection, you should not drive for the next 3 hours due to anesthetics used in the procedure. Please have someone drive for you.  *If you have had sedation, Valium, Xanax, or lorazepam: Do not drive or use public transportation for 24 hours, do not operating hazardous machinery or make important personal/business decisions for 24 hours.  POSSIBLE STEROID SIDE EFFECTS: If experienced these should only last for a short period. Change in menstrual flow  Edema in (swelling)  Increased appetite Skin flushing (redness)  Skin rash/acne  Thrush (oral) Vaginitis    Increased sweating  Depression Increased blood glucose levels Cramping and leg/calf  Euphoria (feeling happy)  POSSIBLE PROCEDURE SIDE EFFECTS: Please call our office if concerned. Increased pain Increased numbness/tingling  Headache Nausea/vomiting Hematoma (bruising/bleeding) Edema (swelling at the site) Weakness  Infection (red/drainage at site) Fever greater than 100.2F  *In the event of a headache after epidural steroid injection: Drink plenty of fluids, especially water and try to lay flat when possible. If the headache does not get better after a few days  or as always if concerned please call the office.

## 2023-01-29 ENCOUNTER — Encounter: Payer: Self-pay | Admitting: Physical Medicine and Rehabilitation

## 2023-01-29 NOTE — Progress Notes (Signed)
this.  At 82 years old and fairly good health otherwise she may be a candidate for microdiscectomy or laminectomy if needed.  Will see how she does.    Meds & Orders:  Meds ordered this encounter  Medications   methylPREDNISolone acetate (DEPO-MEDROL) injection 80 mg    Orders Placed This Encounter  Procedures   XR C-ARM NO REPORT   Ambulatory referral to Physical Therapy   Epidural Steroid injection    Follow-up: Return for visit to requesting provider as needed.   Procedures: No procedures performed  Lumbar Epidural Steroid Injection - Interlaminar Approach with Fluoroscopic Guidance  Patient: Wendy Vaughn      Date of Birth:  [birth year] MRN: [id number] PCP: Wendy Brazil, DO      Visit Date: 01/27/2023   Universal Protocol:     Consent Given By: the patient  Position: PRONE  Additional Comments: Vital signs were monitored before and after the procedure. Patient was prepped and draped in the usual sterile fashion. The correct patient, procedure, and site was verified.   Injection Procedure Details:   Procedure diagnoses: Lumbar radiculopathy [M54.16]   Meds Administered:  Meds ordered this encounter  Medications   methylPREDNISolone acetate (DEPO-MEDROL) injection 80 mg     Laterality: Right  Location/Site:  L4-5  Needle: 3.5 in., 20 ga. Tuohy  Needle Placement: Paramedian epidural  Findings:   -Comments: Excellent flow of contrast into the epidural space.  Procedure Details: Using a paramedian approach from the side mentioned above, the region overlying the inferior lamina was localized under fluoroscopic visualization and the soft tissues overlying this structure were infiltrated with 4 ml. of 1% Lidocaine without Epinephrine. The Tuohy needle was inserted into the epidural space using a paramedian approach.   The epidural space was localized using loss of resistance along with counter oblique bi-planar fluoroscopic views.  After negative aspirate for air, blood, and CSF, a 2 ml. volume of Isovue-250 was injected into the epidural space and the flow of contrast was observed. Radiographs were obtained for documentation purposes.    The injectate was administered into the level noted above.   Additional Comments:  No complications occurred Dressing: 2 x 2 sterile gauze and Band-Aid    Post-procedure details: Patient was observed during the procedure. Post-procedure instructions were reviewed.  Patient left the clinic in stable condition.   Clinical History: CLINICAL DATA:  Low back pain radiating down the right hip and leg for over 6 weeks.   EXAM: MRI LUMBAR SPINE WITHOUT  CONTRAST   TECHNIQUE: Multiplanar, multisequence MR imaging of the lumbar spine was performed. No intravenous contrast was administered.   COMPARISON:  09/03/2013   FINDINGS: Segmentation:  Standard.   Alignment:  Slight retrolisthesis at L3-4   Vertebrae:  No fracture, evidence of discitis, or bone lesion.   Conus medullaris and cauda equina: Conus extends to the L1 level. Conus and cauda equina appear normal.   Paraspinal and other soft tissues: Negative for perispinal mass or inflammation. Mild left renal atrophy.   Disc levels:   T12- L1: Mild disc height loss and bulging   L1-L2: Disc height loss with ventral spondylitic spurring.   L2-L3: Disc space narrowing with circumferential bulging and mild facet spurring   L3-L4: Circumferential disc bulging. Right paracentral downward migrating extrusion impinging on the right L4 nerve root. Central protrusion causing mild to moderate midline thecal sac stenosis. Mild degenerative facet spurring. The foramina are patent   L4-L5: Disc bulging with biforaminal protrusion. Mild facet  Wendy Vaughn - 82 y.o. female MRN [id number]  Date of birth: April 07, 1941  Office Visit Note: Visit Date: 01/27/2023 PCP: Wendy Brazil, DO Referred by: Wendy Brazil, DO  Subjective: Chief Complaint  Patient presents with   Lower Back - Pain   HPI: Wendy Vaughn is a 82 y.o. female who comes in today  for evaluation and management continued right low back and hip and leg pain.  By way of quick review she has had fairly classic L4 radicular complaints of the medial side of the knee down to the medial ankle.  She does have disc herniation at L3-4 affecting the lateral recess at that area.  No specific nerve compression however.  She has multilevel facet arthropathy and no high-grade stenosis.  She has a history over the years of multiple pain complaints multiple joint pains.  She is followed by Wendy Vaughn from an orthopedic standpoint we have seen her over the last several weeks to months here with this similar pain.  She initially had so much pain and wanted a shot done after MRI findings that she went to [facility] imaging for L3-4 interlaminar injection that really did not help much.  This was followed by a transforaminal injection that seemingly has helped her quite a bit.  She is still having pain from the medial knee down to the foot and she is worried today that she is having some type of nerve damage in the foot.  She is undergoing some physical therapy now and that is getting started.  She had therapy in the past.  Her case is complicated by type 2 diabetes but not really with a history of neuropathy.  She has no focal weakness but is felt like she has been dragging her leg now for a while.  She is ambulating today much better than we have seen her over the last several weeks and she does admit to me that she is doing much better just with significant pain from the knee down.  Some paresthesia.   I spent more than 30 minutes speaking face-to-face with the patient with 50% of  the time in counseling and discussing coordination of care.       Review of Systems  Musculoskeletal:  Positive for back pain and joint pain.  Neurological:  Positive for tingling and weakness.  All other systems reviewed and are negative.  Otherwise per HPI.  Assessment & Plan: Visit Diagnoses:    ICD-10-CM   1. Lumbar radiculopathy  M54.16 XR C-ARM NO REPORT    Epidural Steroid injection    methylPREDNISolone acetate (DEPO-MEDROL) injection 80 mg    Ambulatory referral to Physical Therapy    2. 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 Gait abnormality  R26.9 Ambulatory referral to Physical Therapy       Plan: Findings:  Complicated chronic pain patient with type 2 diabetes and osteoarthritis and history of L4 radicular type symptoms into the leg.  Strength exam is good today but she does have a little bit less effort on the right with knee extension but it seems intact pretty well.  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  L4 transforaminal injection has seemingly helped quite a bit with her pain level.  I do think the neck step is an L4-5 interlaminar approach just to see if we can get a little bit more pain relief and she will continue with physical therapy and watch  Wendy Vaughn - 82 y.o. female MRN [id number]  Date of birth: April 07, 1941  Office Visit Note: Visit Date: 01/27/2023 PCP: Wendy Brazil, DO Referred by: Wendy Brazil, DO  Subjective: Chief Complaint  Patient presents with   Lower Back - Pain   HPI: Wendy Vaughn is a 82 y.o. female who comes in today  for evaluation and management continued right low back and hip and leg pain.  By way of quick review she has had fairly classic L4 radicular complaints of the medial side of the knee down to the medial ankle.  She does have disc herniation at L3-4 affecting the lateral recess at that area.  No specific nerve compression however.  She has multilevel facet arthropathy and no high-grade stenosis.  She has a history over the years of multiple pain complaints multiple joint pains.  She is followed by Wendy Vaughn from an orthopedic standpoint we have seen her over the last several weeks to months here with this similar pain.  She initially had so much pain and wanted a shot done after MRI findings that she went to [facility] imaging for L3-4 interlaminar injection that really did not help much.  This was followed by a transforaminal injection that seemingly has helped her quite a bit.  She is still having pain from the medial knee down to the foot and she is worried today that she is having some type of nerve damage in the foot.  She is undergoing some physical therapy now and that is getting started.  She had therapy in the past.  Her case is complicated by type 2 diabetes but not really with a history of neuropathy.  She has no focal weakness but is felt like she has been dragging her leg now for a while.  She is ambulating today much better than we have seen her over the last several weeks and she does admit to me that she is doing much better just with significant pain from the knee down.  Some paresthesia.   I spent more than 30 minutes speaking face-to-face with the patient with 50% of  the time in counseling and discussing coordination of care.       Review of Systems  Musculoskeletal:  Positive for back pain and joint pain.  Neurological:  Positive for tingling and weakness.  All other systems reviewed and are negative.  Otherwise per HPI.  Assessment & Plan: Visit Diagnoses:    ICD-10-CM   1. Lumbar radiculopathy  M54.16 XR C-ARM NO REPORT    Epidural Steroid injection    methylPREDNISolone acetate (DEPO-MEDROL) injection 80 mg    Ambulatory referral to Physical Therapy    2. 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 Gait abnormality  R26.9 Ambulatory referral to Physical Therapy       Plan: Findings:  Complicated chronic pain patient with type 2 diabetes and osteoarthritis and history of L4 radicular type symptoms into the leg.  Strength exam is good today but she does have a little bit less effort on the right with knee extension but it seems intact pretty well.  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  L4 transforaminal injection has seemingly helped quite a bit with her pain level.  I do think the neck step is an L4-5 interlaminar approach just to see if we can get a little bit more pain relief and she will continue with physical therapy and watch  this.  At 82 years old and fairly good health otherwise she may be a candidate for microdiscectomy or laminectomy if needed.  Will see how she does.    Meds & Orders:  Meds ordered this encounter  Medications   methylPREDNISolone acetate (DEPO-MEDROL) injection 80 mg    Orders Placed This Encounter  Procedures   XR C-ARM NO REPORT   Ambulatory referral to Physical Therapy   Epidural Steroid injection    Follow-up: Return for visit to requesting provider as needed.   Procedures: No procedures performed  Lumbar Epidural Steroid Injection - Interlaminar Approach with Fluoroscopic Guidance  Patient: Wendy Vaughn      Date of Birth:  [birth year] MRN: [id number] PCP: Wendy Brazil, DO      Visit Date: 01/27/2023   Universal Protocol:     Consent Given By: the patient  Position: PRONE  Additional Comments: Vital signs were monitored before and after the procedure. Patient was prepped and draped in the usual sterile fashion. The correct patient, procedure, and site was verified.   Injection Procedure Details:   Procedure diagnoses: Lumbar radiculopathy [M54.16]   Meds Administered:  Meds ordered this encounter  Medications   methylPREDNISolone acetate (DEPO-MEDROL) injection 80 mg     Laterality: Right  Location/Site:  L4-5  Needle: 3.5 in., 20 ga. Tuohy  Needle Placement: Paramedian epidural  Findings:   -Comments: Excellent flow of contrast into the epidural space.  Procedure Details: Using a paramedian approach from the side mentioned above, the region overlying the inferior lamina was localized under fluoroscopic visualization and the soft tissues overlying this structure were infiltrated with 4 ml. of 1% Lidocaine without Epinephrine. The Tuohy needle was inserted into the epidural space using a paramedian approach.   The epidural space was localized using loss of resistance along with counter oblique bi-planar fluoroscopic views.  After negative aspirate for air, blood, and CSF, a 2 ml. volume of Isovue-250 was injected into the epidural space and the flow of contrast was observed. Radiographs were obtained for documentation purposes.    The injectate was administered into the level noted above.   Additional Comments:  No complications occurred Dressing: 2 x 2 sterile gauze and Band-Aid    Post-procedure details: Patient was observed during the procedure. Post-procedure instructions were reviewed.  Patient left the clinic in stable condition.   Clinical History: CLINICAL DATA:  Low back pain radiating down the right hip and leg for over 6 weeks.   EXAM: MRI LUMBAR SPINE WITHOUT  CONTRAST   TECHNIQUE: Multiplanar, multisequence MR imaging of the lumbar spine was performed. No intravenous contrast was administered.   COMPARISON:  09/03/2013   FINDINGS: Segmentation:  Standard.   Alignment:  Slight retrolisthesis at L3-4   Vertebrae:  No fracture, evidence of discitis, or bone lesion.   Conus medullaris and cauda equina: Conus extends to the L1 level. Conus and cauda equina appear normal.   Paraspinal and other soft tissues: Negative for perispinal mass or inflammation. Mild left renal atrophy.   Disc levels:   T12- L1: Mild disc height loss and bulging   L1-L2: Disc height loss with ventral spondylitic spurring.   L2-L3: Disc space narrowing with circumferential bulging and mild facet spurring   L3-L4: Circumferential disc bulging. Right paracentral downward migrating extrusion impinging on the right L4 nerve root. Central protrusion causing mild to moderate midline thecal sac stenosis. Mild degenerative facet spurring. The foramina are patent   L4-L5: Disc bulging with biforaminal protrusion. Mild facet

## 2023-01-29 NOTE — Procedures (Signed)
Lumbar Epidural Steroid Injection - Interlaminar Approach with Fluoroscopic Guidance  Patient: Wendy Vaughn      Date of Birth: 08/01/1940 MRN: [id number] PCP: Leola Brazil, DO      Visit Date: 01/27/2023   Universal Protocol:     Consent Given By: the patient  Position: PRONE  Additional Comments: Vital signs were monitored before and after the procedure. Patient was prepped and draped in the usual sterile fashion. The correct patient, procedure, and site was verified.   Injection Procedure Details:   Procedure diagnoses: Lumbar radiculopathy [M54.16]   Meds Administered:  Meds ordered this encounter  Medications   methylPREDNISolone acetate (DEPO-MEDROL) injection 80 mg     Laterality: Right  Location/Site:  L4-5  Needle: 3.5 in., 20 ga. Tuohy  Needle Placement: Paramedian epidural  Findings:   -Comments: Excellent flow of contrast into the epidural space.  Procedure Details: Using a paramedian approach from the side mentioned above, the region overlying the inferior lamina was localized under fluoroscopic visualization and the soft tissues overlying this structure were infiltrated with 4 ml. of 1% Lidocaine without Epinephrine. The Tuohy needle was inserted into the epidural space using a paramedian approach.   The epidural space was localized using loss of resistance along with counter oblique bi-planar fluoroscopic views.  After negative aspirate for air, blood, and CSF, a 2 ml. volume of Isovue-250 was injected into the epidural space and the flow of contrast was observed. Radiographs were obtained for documentation purposes.    The injectate was administered into the level noted above.   Additional Comments:  No complications occurred Dressing: 2 x 2 sterile gauze and Band-Aid    Post-procedure details: Patient was observed during the procedure. Post-procedure instructions were reviewed.  Patient left the clinic in stable condition.

## 2023-02-26 ENCOUNTER — Ambulatory Visit: Payer: Medicare Other | Attending: Physical Medicine and Rehabilitation | Admitting: Physical Therapy

## 2023-03-17 ENCOUNTER — Telehealth: Payer: Self-pay | Admitting: Physical Medicine and Rehabilitation

## 2023-03-17 NOTE — Telephone Encounter (Signed)
Patient called and said she hurt her back again doing yard work.  Would like to see if she can come in for injection. Please call her back.

## 2023-03-17 NOTE — Therapy (Addendum)
OUTPATIENT PHYSICAL THERAPY THORACOLUMBAR EVALUATION/Discharge  PHYSICAL THERAPY DISCHARGE SUMMARY  Visits from Start of Care: 1 eval only  Current functional level related to goals / functional outcomes: NA   Remaining deficits: NA   Education / Equipment: NA  Plan: Patient agrees to discharge.  Patient goals were not met. Patient is being discharged due to request, patient is getting surgery for worsened symptoms and unable to participate in PT at this time.      Jena Gauss, PT, DPT  03/31/2023 1:57 PM    Patient Name: Wendy Vaughn MRN: [id number] DOB:[birth year], 82 y.o., female Today's Date: 03/19/2023  END OF SESSION:  PT End of Session - 03/19/23 1409     Visit Number 1    Number of Visits 20    Date for PT Re-Evaluation 05/28/23    Authorization Type Medicare & AARP    Progress Note Due on Visit 10    PT Start Time 1400    PT Stop Time 1440    PT Time Calculation (min) 40 min    Activity Tolerance Patient limited by pain    Behavior During Therapy [facility] for tasks assessed/performed             History reviewed. No pertinent past medical history. History reviewed. No pertinent surgical history. Patient Active Problem List   Diagnosis Date Noted   Unilateral primary osteoarthritis, left knee 07/21/2017   Chronic pain of left knee 07/21/2017   Status post arthroscopy of left knee 06/23/2017   Other tear of medial meniscus, current injury, left knee, subsequent encounter 06/02/2017   De Quervain's tenosynovitis, left 09/06/2015   Trigger middle finger of right hand 09/06/2015   Benign essential HTN 06/21/2015   Mixed hyperlipidemia 06/21/2015   Type 2 diabetes mellitus without complication (HCC) 06/21/2015   Myofascial pain 11/24/2012   Cervical radiculopathy 05/29/2011   DDD (degenerative disc disease), cervical 05/29/2011    PCP: Leola Brazil, DO   REFERRING PROVIDER: Juanda Chance, NP   REFERRING DIAG:  M54.16 (ICD-10-CM) - Lumbar  radiculopathy  M51.16 (ICD-10-CM) - Intervertebral disc disorders with radiculopathy, lumbar region  R26.9 (ICD-10-CM) - Gait abnormality    Rationale for Evaluation and Treatment: Rehabilitation  THERAPY DIAG:  Radiculopathy, lumbar region  Acute right-sided low back pain with right-sided sciatica  Muscle weakness (generalized)  Unsteadiness on feet  Other abnormalities of gait and mobility  ONSET DATE:  few years, noticed worsened after May 2023   SUBJECTIVE:  SUBJECTIVE STATEMENT: I've had 3 shots in my back.  The pain had gotten down to livabel, if I had to rate each day going down my R leg it was 1-2 instead of 7-8,  and usually I don't have to use the cane.  The last time I was in I made a comment that I'm not sure on my feet and curbs give me anxiety, it was my balance that worried my.  Inside my house I use my walker at night.  I clean my yard in the wheelchair, I hold onto it like a walker.  Today I'm in bad pain because I cleaned all the leaves in my daughter's yard so I can't move because I'm in so much pain.  I walk like a little old lady, I've always had a small stride but its gotten smaller, my daughter can't walk slow enough for me.  I like to go to [place] games and they have to pick me up and take me in the golf cart, but I'm scared to death of the teenagers bumping into me.  I'm trying to be active, but I need balance, and to work on my gait.  I go to [facility] after this appt.    PERTINENT HISTORY:  From MD note "fairly classic L4 radicular complaints of the medial side of the knee down to the medial ankle. She does have disc herniation at L3-4 affecting the lateral recess at that area. No specific nerve compression however. She has multilevel facet arthropathy and no high-grade stenosis  " T2DM, L knee OA, h/o L knee surgery for meniscus repair, cervical DDD, HTN, chronic LBP, lumbar radiculopathy   PAIN:  Are you having pain? Yes: NPRS scale: 10/10 Pain location: low back  Pain description: spasm Aggravating factors: yard work  Relieving factors: shots  PRECAUTIONS: Fall  RED FLAGS: None   WEIGHT BEARING RESTRICTIONS: No  FALLS:  Has patient fallen in last 6 months? No  LIVING ENVIRONMENT: Lives with: lives alone Lives in: House/apartment Stairs: Yes: Internal: 17 steps; stair lift to basement Has following equipment at home: Quad cane large base, Environmental consultant - 2 wheeled, and Wheelchair (manual)  OCCUPATION: retired  PLOF: Independent with household mobility with device  PATIENT GOALS: work on Event organiser  NEXT MD VISIT: today  OBJECTIVE:   DIAGNOSTIC FINDINGS:  10/21/22 MR Lumbar spine IMPRESSION: 1. L3-4 right paracentral extrusion impinging on the right L4 nerve root. 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 2. L5-S1 left subarticular recess stenosis and S1 impingement.  PATIENT SURVEYS:  ABC scale 690/1600 = 43.1% Modified Oswestry 18/50   COGNITION: Overall cognitive status: Within functional limits for tasks assessed     SENSATION: R L4 radiculopathy - numbness and tingling down   MUSCLE LENGTH: NT  POSTURE: rounded shoulders, forward head, and decreased lumbar lordosis  Unable to stand fully erect, maintaining flexed posture with downward looking gaze.   PALPATION: Tenderness R lumbar paraspinals  LUMBAR ROM:  NT today due to acute pain and poor tolerance    LOWER EXTREMITY MMT:    MMT Right eval Left eval  Hip flexion 4 4  Hip extension    Hip abduction 5 5  Hip adduction 5 5  Knee flexion 5 5  Knee extension 4+ 4+  Ankle dorsiflexion    Ankle plantarflexion     (Blank rows = not tested)  LUMBAR SPECIAL TESTS:  NT  FUNCTIONAL TESTS:  30 seconds chair  stand  test - 1  mCTSIB - 30 sec position 1 increased sway, position 2 - 10 seconds before having to sit down.    GAIT: Distance walked: 3 feet Assistive device utilized: Quad cane small base Level of assistance: SBA Comments: limited by pain, stepping to transfer w/c to table primarily today  TODAY'S TREATMENT:                                                                                                                              DATE:   03/19/2023 EVAL  Modalities: MHP to low back in sitting during evaluation to decrease pain.     PATIENT EDUCATION:  Education details: POC Person educated: Patient Education method: Explanation Education comprehension: verbalized understanding  HOME EXERCISE PROGRAM: TBD  ASSESSMENT:  CLINICAL IMPRESSION: Wendy Vaughn  is an 82 y.o. female who was seen today for physical therapy evaluation and treatment for lumbar radiculopathy.   In additional to LBP, she also has unsteady gait and fear of falling.  Today patient arrived in w/c due to acute low back pain after overdoing yard work yesterday, she has follow-up after this visit at [facility] about her back pain.  Her primary concern is not her back, since prior to today it has been well managed with injections and is typically only 1-2/10, but her gait and balance.   Patient presents with impaired activity tolerance, impaired standing balance, impaired ambulation, and decreased safety awareness impacting safe and independent functional mobility. Examination also reveals patient is at risk for falls and functional decline as evidenced by the following objective test measures: Gait speed <0.4 m/sec, (37m/sec is needed for community access), unable to remain standing for mCTSIB, (30sec in each position demonstrates equal weighting of balance systems), 30 sec chair rise time of 1  (<8 indicates increased risk for falls and decreased BLE power). She scored 43.1% on ABCscale indicating low level of physical  functioning.  Wendy Vaughn will benefit from skilled physical therapy services to help reach the maximal level of functional independence and mobility, decrease risk of falls, and decrease LBP. Patient demonstrates understanding of this plan of care and is in agreement with this plan.    OBJECTIVE IMPAIRMENTS: Abnormal gait, decreased activity tolerance, decreased balance, decreased endurance, decreased mobility, difficulty walking, decreased ROM, decreased strength, hypomobility, increased fascial restrictions, impaired perceived functional ability, increased muscle spasms, impaired sensation, postural dysfunction, and pain.   ACTIVITY LIMITATIONS: carrying, lifting, bending, sitting, standing, squatting, sleeping, stairs, transfers, and locomotion level  PARTICIPATION LIMITATIONS: meal prep, cleaning, laundry, driving, shopping, community activity, and yard work  PERSONAL FACTORS: Age, Past/current experiences, Time since onset of injury/illness/exacerbation, and 3+ comorbidities: T2DM, L knee OA, h/o L knee surgery for meniscus repair, cervical DDD, HTN, chronic LBP, lumbar radiculopathy  are also affecting patient's functional outcome.   REHAB POTENTIAL: Good  CLINICAL DECISION MAKING: Evolving/moderate complexity  EVALUATION COMPLEXITY: Moderate   GOALS: Goals reviewed with patient? Yes  SHORT  TERM GOALS: Target date: 04/09/2023   Patient will be independent with initial HEP.  Baseline: needs Goal status: INITIAL  2.  Patient will demonstrate decreased fall risk by scoring < 25 sec on TUG. Baseline: NT due to pain Goal status: INITIAL  LONG TERM GOALS: Target date: 05/28/2023   Patient will be independent with advanced/ongoing HEP to improve outcomes and carryover.  Baseline:  Goal status: INITIAL  2.  Patient will report 75% improvement in low back pain to improve QOL.  Baseline: severe Goal status: INITIAL  3.  Patient will be able to ambulate 600' with LRAD with good  safety to access community.  Baseline: unable Goal status: INITIAL  4.  Patient will demonstrate improved functional LE strength as demonstrated by completing 5 STS in 30 second. Baseline: 1 STS in 30 seconds Goal status: INITIAL  5.  Patient will demonstrate at least 19/24 on DGI to improve gait stability and reduce risk for falls. Baseline: NT Goal status: INITIAL  6.  Patient will score >45/56 on Berg Balance test to demonstrate lower risk of falls. (MCID= 8 points) .  Baseline: NT Goal status: INITIAL  7.  Patient will report 50% on ABC scale to demonstrate improved functional ability. Baseline: 43.1% Goal status: INITIAL  8.  Patient will demonstrate gait speed of 0.55 m/s to be a safe limited community ambulator with decreased risk for recurrent falls.  Baseline: < 0.4 m/s Goal status: INITIAL    PLAN:  PT FREQUENCY: 1-2x/week  PT DURATION: 10 weeks  PLANNED INTERVENTIONS: 97164- PT Re-evaluation, 97110-Therapeutic exercises, 97530- Therapeutic activity, 97112- Neuromuscular re-education, 97535- Self Care, 00938- Manual therapy, [id number]- Gait training, [phone]- Orthotic Fit/training, 97014- Electrical stimulation (unattended), [id number]- Electrical stimulation (manual), [id number]- Ultrasound, [id number]- Traction (mechanical), Balance training, Stair training, Taping, Dry Needling, Joint mobilization, Joint manipulation, Spinal manipulation, Spinal mobilization, Vestibular training, Cryotherapy, and Moist heat.  PLAN FOR NEXT SESSION: TUG, DGI/Berg if able, start counter exercises, manual therapy, modalities PRN   Jena Gauss, PT 03/19/2023, 5:12 PM

## 2023-03-18 ENCOUNTER — Telehealth: Payer: Self-pay

## 2023-03-18 NOTE — Telephone Encounter (Signed)
Patient called back, following up from yesterday's message. She said that nobody ever called her and she was not happy. Please call for scheduling... [phone] She will be available after 12:30pm today to receive a phone call.

## 2023-03-19 ENCOUNTER — Encounter: Payer: Self-pay | Admitting: Physical Therapy

## 2023-03-19 ENCOUNTER — Ambulatory Visit: Payer: Medicare Other | Attending: Physical Medicine and Rehabilitation | Admitting: Physical Therapy

## 2023-03-19 ENCOUNTER — Other Ambulatory Visit: Payer: Self-pay

## 2023-03-19 ENCOUNTER — Ambulatory Visit (INDEPENDENT_AMBULATORY_CARE_PROVIDER_SITE_OTHER): Payer: Medicare Other | Admitting: Physical Medicine and Rehabilitation

## 2023-03-19 DIAGNOSIS — G8929 Other chronic pain: Secondary | ICD-10-CM | POA: Diagnosis not present

## 2023-03-19 DIAGNOSIS — R2689 Other abnormalities of gait and mobility: Secondary | ICD-10-CM | POA: Diagnosis present

## 2023-03-19 DIAGNOSIS — R269 Unspecified abnormalities of gait and mobility: Secondary | ICD-10-CM | POA: Insufficient documentation

## 2023-03-19 DIAGNOSIS — M6281 Muscle weakness (generalized): Secondary | ICD-10-CM | POA: Diagnosis present

## 2023-03-19 DIAGNOSIS — M5416 Radiculopathy, lumbar region: Secondary | ICD-10-CM | POA: Diagnosis present

## 2023-03-19 DIAGNOSIS — R2681 Unsteadiness on feet: Secondary | ICD-10-CM | POA: Diagnosis present

## 2023-03-19 DIAGNOSIS — M5441 Lumbago with sciatica, right side: Secondary | ICD-10-CM

## 2023-03-19 DIAGNOSIS — M5116 Intervertebral disc disorders with radiculopathy, lumbar region: Secondary | ICD-10-CM

## 2023-03-19 MED ORDER — TRAMADOL HCL 50 MG PO TABS: 50.0000 mg | ORAL_TABLET | Freq: Three times a day (TID) | ORAL | 0 refills | Status: DC | PRN

## 2023-03-19 NOTE — Progress Notes (Signed)
Wendy Vaughn - 82 y.o. female MRN [id number]  Date of birth: 09/20/1940  Office Visit Note: Visit Date: 03/19/2023 PCP: Leola Brazil, DO Referred by: Leola Brazil, DO  Subjective: Chief Complaint  Patient presents with   Lower Back - Pain   HPI: Wendy Vaughn is a 82 y.o. female who comes in today for evaluation of chronic, worsening and severe bilateral lower back pain radiating down right anterolateral leg to foot. Pain ongoing for several months, worsened yesterday after doing yard work. Her pain becomes severe when sitting and laying down. Reports difficulty walking today due to pain. She describes her pain as sharp, sore and aching, currently rates as 10 out of 10. Some relief of pain with home exercise regimen, rest and use of medications. She started formal physical therapy today. She does take Tramadol intermittently for severe pain. Recent lumbar MRI imaging exhibits right paracentral extrusion at L3-L4 causing mild to moderate spinal canal stenosis, there is also left paracentral disc protrusion at L5-S1. Patient has undergone multiple epidural steroid injections, most recent was right L4-L5 interlaminar epidural steroid injection on 01/27/2023. She reports significant relief of pain, greater than 50% for 1 month. States her pain has gradually started to increase over the last several weeks, became severe yesterday. Patient denies focal weakness, numbness and tingling. No recent trauma or falls. She is currently using cane to assist with ambulation.    Review of Systems  Musculoskeletal:  Positive for back pain.  Neurological:  Negative for tingling, sensory change, focal weakness and weakness.  All other systems reviewed and are negative.  Otherwise per HPI.  Assessment & Plan: Visit Diagnoses:    ICD-10-CM   1. Chronic bilateral low back pain with right-sided sciatica  M54.41    G89.29     2. Lumbar radiculopathy  M54.16     3. Intervertebral disc disorders with  radiculopathy, lumbar region  M51.16        Plan: Findings:  Chronic, worsening and severe bilateral lower back pain radiating down right anterolateral leg to foot. Patient continues to have severe pain despite good conservative therapies such as home exercise regimen, rest and use of medications. Patients clinical presentation and exam are consistent with L4 nerve pattern. We discussed treatment plan in detail today, she has undergone (3) epidural steroid injections since June with intermittent relief of pain. There is right paracentral disc extrusion at L3-L4. I explained to patient that we are not able to perform injections frequently, would not recommend repeating injection at this time. We discussed medication management, I refilled Tramadol and would like to see her back for follow up in about 6 weeks. I encouraged her to call us in a few weeks if her pain persists, would consider consult with Dr. Willia Craze to discuss surgical options. I encouraged her to continue with home exercise regimen and formal physical therapy. No red flag symptoms noted upon exam today.     Meds & Orders:  Meds ordered this encounter  Medications   traMADol (ULTRAM) 50 MG tablet    Sig: Take 1 tablet (50 mg total) by mouth every 8 (eight) hours as needed for severe pain (pain score 7-10).    Dispense:  20 tablet    Refill:  0   No orders of the defined types were placed in this encounter.   Follow-up: No follow-ups on file.   Procedures: No procedures performed      Clinical History: CLINICAL DATA:  Low back pain radiating  down the right hip and leg for over 6 weeks.   EXAM: MRI LUMBAR SPINE WITHOUT CONTRAST   TECHNIQUE: Multiplanar, multisequence MR imaging of the lumbar spine was performed. No intravenous contrast was administered.   COMPARISON:  09/03/2013   FINDINGS: Segmentation:  Standard.   Alignment:  Slight retrolisthesis at L3-4   Vertebrae:  No fracture, evidence of discitis, or  bone lesion.   Conus medullaris and cauda equina: Conus extends to the L1 level. Conus and cauda equina appear normal.   Paraspinal and other soft tissues: Negative for perispinal mass or inflammation. Mild left renal atrophy.   Disc levels:   T12- L1: Mild disc height loss and bulging   L1-L2: Disc height loss with ventral spondylitic spurring.   L2-L3: Disc space narrowing with circumferential bulging and mild facet spurring   L3-L4: Circumferential disc bulging. Right paracentral downward migrating extrusion impinging on the right L4 nerve root. Central protrusion causing mild to moderate midline thecal sac stenosis. Mild degenerative facet spurring. The foramina are patent   L4-L5: Disc bulging with biforaminal protrusion. Mild facet spurring. Mild triangular narrowing.   L5-S1:Disc narrowing and bulging with left paracentral protrusion and ridging. Degenerative facet spurring asymmetric to the left. Left subarticular recess stenosis that could affect the left S1 nerve root.   IMPRESSION: 1. L3-4 right paracentral extrusion impinging on the right L4 nerve root. 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 2. L5-S1 left subarticular recess stenosis and S1 impingement.     Electronically Signed   By: Tiburcio Pea M.D.   On: 10/31/2022 07:27   She reports that she has never smoked. She has never used smokeless tobacco. No results for input(s): "HGBA1C", "LABURIC" in the last 8760 hours.  Objective:  VS:  HT:    WT:   BMI:     BP:   HR: bpm  TEMP: ( )  RESP:  Physical Exam Vitals and nursing note reviewed.  HENT:     Head: Normocephalic and atraumatic.     Right Ear: External ear normal.     Left Ear: External ear normal.     Nose: Nose normal.     Mouth/Throat:     Mouth: Mucous membranes are moist.  Eyes:     Extraocular Movements: Extraocular movements intact.  Cardiovascular:     Rate and Rhythm: Normal  rate.     Pulses: Normal pulses.  Pulmonary:     Effort: Pulmonary effort is normal.  Abdominal:     General: Abdomen is flat. There is no distension.  Musculoskeletal:        General: Tenderness present.     Cervical back: Normal range of motion.     Comments: Patient is slow to rise from seated position to standing. Good lumbar range of motion. No pain noted with facet loading. 5/5 strength noted with bilateral hip flexion, knee flexion/extension, ankle dorsiflexion/plantarflexion and EHL. No clonus noted bilaterally. No pain upon palpation of greater trochanters. No pain with internal/external rotation of bilateral hips. Sensation intact bilaterally. Dysesthesias noted to right L4 dermatome. Negative slump test bilaterally. Ambulates with cane, gait unsteady.    Skin:    General: Skin is warm and dry.     Capillary Refill: Capillary refill takes less than 2 seconds.  Neurological:     Mental Status: She is alert and oriented to person, place, and time.     Gait: Gait abnormal.  Psychiatric:  Mood and Affect: Mood normal.        Behavior: Behavior normal.     Ortho Exam  Imaging: No results found.  Past Medical/Family/Surgical/Social History: Medications & Allergies reviewed per EMR, new medications updated. Patient Active Problem List   Diagnosis Date Noted   Unilateral primary osteoarthritis, left knee 07/21/2017   Chronic pain of left knee 07/21/2017   Status post arthroscopy of left knee 06/23/2017   Other tear of medial meniscus, current injury, left knee, subsequent encounter 06/02/2017   De Quervain's tenosynovitis, left 09/06/2015   Trigger middle finger of right hand 09/06/2015   Benign essential HTN 06/21/2015   Mixed hyperlipidemia 06/21/2015   Type 2 diabetes mellitus without complication (HCC) 06/21/2015   Myofascial pain 11/24/2012   Cervical radiculopathy 05/29/2011   DDD (degenerative disc disease), cervical 05/29/2011   No past medical history on  file. No family history on file. No past surgical history on file. Social History   Occupational History   Not on file  Tobacco Use   Smoking status: Never   Smokeless tobacco: Never  Substance and Sexual Activity   Alcohol use: Not on file   Drug use: Not on file   Sexual activity: Not on file

## 2023-03-20 ENCOUNTER — Encounter: Payer: Self-pay | Admitting: Physical Medicine and Rehabilitation

## 2023-03-24 ENCOUNTER — Telehealth: Payer: Self-pay

## 2023-03-24 ENCOUNTER — Other Ambulatory Visit: Payer: Self-pay | Admitting: Physical Medicine and Rehabilitation

## 2023-03-24 DIAGNOSIS — M5116 Intervertebral disc disorders with radiculopathy, lumbar region: Secondary | ICD-10-CM

## 2023-03-24 DIAGNOSIS — M5416 Radiculopathy, lumbar region: Secondary | ICD-10-CM

## 2023-03-24 NOTE — Telephone Encounter (Signed)
Patient called triage. She states that she cannot get out of bed and walk this morning due to back pain. She states that she wants to speak with [facility]. Last office visit 03/19/23 Please call her at  #[phone]

## 2023-03-24 NOTE — Progress Notes (Signed)
Spoke with patient this morning, she reports continued lower back pain. Short term relief of pain with injections. We feel she needs referral to Dr. Willia Craze to discuss surgical options. I will go ahead and place referral. Patient is aware this could take a few weeks to get in with him.

## 2023-03-25 ENCOUNTER — Other Ambulatory Visit: Payer: Self-pay | Admitting: Physical Medicine and Rehabilitation

## 2023-03-25 ENCOUNTER — Telehealth: Payer: Self-pay

## 2023-03-25 DIAGNOSIS — M5416 Radiculopathy, lumbar region: Secondary | ICD-10-CM

## 2023-03-25 MED ORDER — HYDROCODONE-ACETAMINOPHEN 5-325 MG PO TABS: 1.0000 | ORAL_TABLET | Freq: Three times a day (TID) | ORAL | 0 refills | Status: DC | PRN

## 2023-03-25 MED ORDER — AMITRIPTYLINE HCL 25 MG PO TABS: 25.0000 mg | ORAL_TABLET | Freq: Every day | ORAL | 0 refills | Status: AC

## 2023-03-25 NOTE — Progress Notes (Signed)
Spoke with patient this morning, states her pain is severe and is unable to walk. I advised she could go to emergency room for evaluation. I also changed her medications to Norco and Elavil at bedtime. She has referral to Dr. Christell Constant and we will get her in as soon as possible.

## 2023-03-25 NOTE — Telephone Encounter (Signed)
Patient called the triage line and states she hasn't heard about an appointment with Christell Constant I called her back and let her know that your message said she was aware that it could be a couple weeks and she started crying stating she would be dead by then She said she is in extreme pain and can barely function She said she would have to get an ambulance even to get her to doctors appointments

## 2023-03-26 ENCOUNTER — Ambulatory Visit (INDEPENDENT_AMBULATORY_CARE_PROVIDER_SITE_OTHER): Payer: Medicare Other | Admitting: Orthopedic Surgery

## 2023-03-26 ENCOUNTER — Encounter: Payer: Self-pay | Admitting: Orthopedic Surgery

## 2023-03-26 ENCOUNTER — Other Ambulatory Visit (INDEPENDENT_AMBULATORY_CARE_PROVIDER_SITE_OTHER): Payer: Medicare Other

## 2023-03-26 VITALS — BP 122/81 | HR 76 | Ht 68.0 in | Wt 210.0 lb

## 2023-03-26 DIAGNOSIS — M5416 Radiculopathy, lumbar region: Secondary | ICD-10-CM

## 2023-03-26 MED ORDER — METHYLPREDNISOLONE 4 MG PO TBPK
ORAL_TABLET | ORAL | 0 refills | Status: DC
Start: 2023-03-26 — End: 2023-04-21

## 2023-03-26 NOTE — Progress Notes (Signed)
Orthopedic Spine Surgery Office Note  Assessment: Patient is a 82 y.o. female with right buttock, right lateral thigh, right medial leg pain consistent with radiculopathy.  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 Discussed that the conservative treatments include:  -activity modification  -physical therapy  -over the counter pain medications  -medrol dosepak  -lumbar steroid injections -Patient has tried lumbar steroid injections, gabapentin, Tylenol, tramadol -Talked about her options at this point including the after mentioned above, pain management, lyrica, and surgical decompression.  After our discussion, patient wanted to proceed with surgical decompression -Patient will next be seen at the date of surgery   The patient has developed symptoms radiculopathy in her right leg. MRI showed right sided lateral recess stenosis due to a disc herniation. I explained that most of the time, the symptoms related to herniated disc get better with conservative treatments. She has tried conservative treatment now for over 3 months without any relief of her symptoms. Accordingly, discussed surgery in the form of hemilaminotomy and microdiscectomy as an option for treatment.  The risks of the surgery including but not limited to recurrent disc herniation, persistent pain, dural tear, nerve root injury, infection, fracture, instability, need for additional procedures, and death were discussed with the patient. The benefits of the surgery would be faster relief of the patient's radiating leg pain. I explained that back pain relief is not the goal of the surgery and it is not reliably alleviated with this surgery. The alternatives to surgical management were covered with the patient and included activity modification, physical therapy,  over-the-counter pain medications, pain management, and injections.  All the patient's questions were answered to her satisfaction. After this discussion, the patient expressed understanding and elected to proceed with surgical intervention.    ___________________________________________________________________________   History:  Patient is a 82 y.o. female who presents today for lumbar spine.  Patient has had about 6 months of right leg pain.  She feels it in the right buttock, right lateral thigh going into the right medial leg.  There is no trauma or injury that preceded the onset of the pain.  She had previously been getting lumbar steroid injections which were providing her with good relief.  More recently, she was doing some yard work and had an acute exacerbation of worsening pain.  The pain has persisted since then.  She is not having any pain rating into the left lower extremity.  She does not recall any specific trauma or injury that preceded the onset of pain about 6 months ago.  She feels the pain at all times.  She does not feel it gets much better even with rest.   Weakness: Yes, right leg feels weaker.  No other weakness noted Symptoms of imbalance: Denies Paresthesias and numbness: Denies Bowel or bladder incontinence: Denies Saddle anesthesia: Denies  Treatments tried: lumbar steroid injections, gabapentin, Tylenol, tramadol  Review of systems: Denies fevers and chills, night sweats, unexplained weight loss, history of cancer.  Has had pain that wakes her at night  Past medical history: HLD HTN Chronic pain Prediabetes (last A1c was 6.1 on 07/10/2022)  Allergies: NKDA  Past surgical history:  Left knee arthroscopy  Social history: Denies use of nicotine product (smoking, vaping, patches, smokeless) Alcohol use: Denies Denies recreational drug use   Physical Exam:  BMI of 31.9  General: no acute  distress, appears stated age Neurologic: alert, answering  questions appropriately, following commands Respiratory: unlabored breathing on room air, symmetric chest rise Psychiatric: appropriate affect, normal cadence to speech   MSK (spine):  -Strength exam      Left  Right EHL    5/5  5/5 TA    5/5  5/5 GSC    5/5  5/5 Knee extension  5/5  5/5 Hip flexion   5/5  5/5  -Sensory exam    Sensation intact to light touch in L3-S1 nerve distributions of bilateral lower extremities  -Achilles DTR: 2/4 on the left, 2/4 on the right -Patellar tendon DTR: 2/4 on the left, 1/4 on the right  -Straight leg raise: Negative bilaterally -Femoral nerve stretch test: Negative bilaterally -Clonus: no beats bilaterally  -Left hip exam: No pain through range of motion -Right hip exam: Mild amount of pain with FADIR, no pain through remainder of range of motion, negative Stinchfield, negative FABER, negative SI joint compression test  Imaging: XRs of the lumbar spine from 03/26/2023 was independently reviewed and interpreted, showing 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  Disc height loss at several levels.  Anterior osteophyte formation seen at multiple levels as well.  No fracture or dislocation seen.  No evidence of instability on flexion/extension views.  MRI of the lumbar spine from 10/21/2022 was independently reviewed and interpreted, showing right paracentral disc herniation at L3/4 causing lateral recess stenosis at that level.  Small left paracentral disc herniation at L5/S1 in the area of the S1 nerve root.  No other significant stenosis seen.   Patient name: Wendy Vaughn Patient MRN: [id number] Date of visit: 03/26/23

## 2023-04-14 ENCOUNTER — Encounter: Payer: Medicare Other | Admitting: Physical Therapy

## 2023-04-15 ENCOUNTER — Encounter (HOSPITAL_COMMUNITY): Payer: Self-pay

## 2023-04-15 NOTE — Progress Notes (Addendum)
Surgical Instructions   Your procedure is scheduled on Monday April 21, 2023. Report to [facility] Main Entrance "A" at 5:30 A.M., then check in with the Admitting office. Any questions or running late day of surgery: call [phone]  Questions prior to your surgery date: call [phone], Monday-Friday, 8am-4pm. If you experience any cold or flu symptoms such as cough, fever, chills, shortness of breath, etc. between now and your scheduled surgery, please notify us at the above number.     Remember:  Do not eat after midnight the night before your surgery  You may drink clear liquids until 4:30 the morning of your surgery.   Clear liquids allowed are: Water, Non-Citrus Juices (without pulp), Carbonated Beverages, Clear Tea (no milk, honey, etc.), Black Coffee Only (NO MILK, CREAM OR POWDERED CREAMER of any kind), and Gatorade.  Patient Instructions  The night before surgery:  No food after midnight. ONLY clear liquids after midnight  The day of surgery (if you do NOT have diabetes):  Drink ONE (1) Pre-Surgery Clear Ensure by 4:30 the morning of surgery. Drink in one sitting. Do not sip.  This drink was given to you during your hospital  pre-op appointment visit.  Nothing else to drink after completing the  Pre-Surgery Clear Ensure.         If you have questions, please contact your surgeon's office.    Take these medicines the morning of surgery with A SIP OF WATER  simvastatin (ZOCOR)   May take these medicines IF NEEDED: acetaminophen (TYLENOL)  gabapentin (NEURONTIN)  HYDROcodone-acetaminophen (NORCO/VICODIN)  traMADol (ULTRAM)    One week prior to surgery, STOP taking any Aspirin (unless otherwise instructed by your surgeon) Aleve, Naproxen, Ibuprofen, Motrin, Advil, Goody's, BC's, all herbal medications, fish oil, and non-prescription vitamins.                     Do NOT Smoke (Tobacco/Vaping) for 24 hours prior to your procedure.  If you use a CPAP at  night, you may bring your mask/headgear for your overnight stay.   You will be asked to remove any contacts, glasses, piercing's, hearing aid's, dentures/partials prior to surgery. Please bring cases for these items if needed.    Patients discharged the day of surgery will not be allowed to drive home, and someone needs to stay with them for 24 hours.  SURGICAL WAITING ROOM VISITATION Patients may have no more than 2 support people in the waiting area - these visitors may rotate.   Pre-op nurse will coordinate an appropriate time for 1 ADULT support person, who may not rotate, to accompany patient in pre-op.  Children under the age of 56 must have an adult with them who is not the patient and must remain in the main waiting area with an adult.  If the patient needs to stay at the hospital during part of their recovery, the visitor guidelines for inpatient rooms apply.  Please refer to the [facility] website for the visitor guidelines for any additional information.   If you received a COVID test during your pre-op visit  it is requested that you wear a mask when out in public, stay away from anyone that may not be feeling well and notify your surgeon if you develop symptoms. If you have been in contact with anyone that has tested positive in the last 10 days please notify you surgeon.      Pre-operative 5 CHG Bathing Instructions   You can play a key  role in reducing the risk of infection after surgery. Your skin needs to be as free of germs as possible. You can reduce the number of germs on your skin by washing with CHG (chlorhexidine gluconate) soap before surgery. CHG is an antiseptic soap that kills germs and continues to kill germs even after washing.   DO NOT use if you have an allergy to chlorhexidine/CHG or antibacterial soaps. If your skin becomes reddened or irritated, stop using the CHG and notify one of our RNs at [phone].   Please shower with the CHG soap starting 4 days  before surgery using the following schedule:     Please keep in mind the following:  DO NOT shave, including legs and underarms, starting the day of your first shower.   You may shave your face at any point before/day of surgery.  Place clean sheets on your bed the day you start using CHG soap. Use a clean washcloth (not used since being washed) for each shower. DO NOT sleep with pets once you start using the CHG.   CHG Shower Instructions:  Wash your face and private area with normal soap. If you choose to wash your hair, wash first with your normal shampoo.  After you use shampoo/soap, rinse your hair and body thoroughly to remove shampoo/soap residue.  Turn the water OFF and apply about 3 tablespoons (45 ml) of CHG soap to a CLEAN washcloth.  Apply CHG soap ONLY FROM YOUR NECK DOWN TO YOUR TOES (washing for 3-5 minutes)  DO NOT use CHG soap on face, private areas, open wounds, or sores.  Pay special attention to the area where your surgery is being performed.  If you are having back surgery, having someone wash your back for you may be helpful. Wait 2 minutes after CHG soap is applied, then you may rinse off the CHG soap.  Pat dry with a clean towel  Put on clean clothes/pajamas   If you choose to wear lotion, please use ONLY the CHG-compatible lotions on the back of this paper.   Additional instructions for the day of surgery: DO NOT APPLY any lotions, deodorants or perfumes.   Do not bring valuables to the hospital. [facility] is not responsible for any belongings/valuables. Do not wear nail polish, gel polish, artificial nails, or any other type of covering on natural nails (fingers and toes) Do not wear jewelry or makeup Put on clean/comfortable clothes.  Please brush your teeth.  Ask your nurse before applying any prescription medications to the skin.     CHG Compatible Lotions   Aveeno Moisturizing lotion  Cetaphil Moisturizing Cream  Cetaphil Moisturizing Lotion   Clairol Herbal Essence Moisturizing Lotion, Dry Skin  Clairol Herbal Essence Moisturizing Lotion, Extra Dry Skin  Clairol Herbal Essence Moisturizing Lotion, Normal Skin  Curel Age Defying Therapeutic Moisturizing Lotion with Alpha Hydroxy  Curel Extreme Care Body Lotion  Curel Soothing Hands Moisturizing Hand Lotion  Curel Therapeutic Moisturizing Cream, Fragrance-Free  Curel Therapeutic Moisturizing Lotion, Fragrance-Free  Curel Therapeutic Moisturizing Lotion, Original Formula  Eucerin Daily Replenishing Lotion  Eucerin Dry Skin Therapy Plus Alpha Hydroxy Crme  Eucerin Dry Skin Therapy Plus Alpha Hydroxy Lotion  Eucerin Original Crme  Eucerin Original Lotion  Eucerin Plus Crme Eucerin Plus Lotion  Eucerin TriLipid Replenishing Lotion  Keri Anti-Bacterial Hand Lotion  Keri Deep Conditioning Original Lotion Dry Skin Formula Softly Scented  Keri Deep Conditioning Original Lotion, Fragrance Free Sensitive Skin Formula  Keri Lotion Fast Absorbing Fragrance Free Sensitive  Skin Formula  Keri Lotion Fast Absorbing Softly Scented Dry Skin Formula  Keri Original Lotion  Keri Skin Renewal Lotion Keri Silky Smooth Lotion  Keri Silky Smooth Sensitive Skin Lotion  Nivea Body Creamy Conditioning Oil  Nivea Body Extra Enriched Lotion  Nivea Body Original Lotion  Nivea Body Sheer Moisturizing Lotion Nivea Crme  Nivea Skin Firming Lotion  NutraDerm 30 Skin Lotion  NutraDerm Skin Lotion  NutraDerm Therapeutic Skin Cream  NutraDerm Therapeutic Skin Lotion  ProShield Protective Hand Cream  Provon moisturizing lotion  Please read over the following fact sheets that you were given.

## 2023-04-16 ENCOUNTER — Encounter (HOSPITAL_COMMUNITY): Payer: Self-pay | Admitting: General Practice

## 2023-04-16 ENCOUNTER — Encounter (HOSPITAL_COMMUNITY)
Admission: RE | Admit: 2023-04-16 | Discharge: 2023-04-16 | Disposition: A | Discharge status: Home / Self Care | Payer: Medicare Other | Source: Ambulatory Visit | Attending: Orthopedic Surgery | Admitting: Orthopedic Surgery

## 2023-04-16 ENCOUNTER — Other Ambulatory Visit: Payer: Self-pay

## 2023-04-16 VITALS — BP 139/85 | HR 57 | Temp 97.6°F | Resp 18 | Ht 68.0 in | Wt 207.5 lb

## 2023-04-16 DIAGNOSIS — Z01812 Encounter for preprocedural laboratory examination: Secondary | ICD-10-CM | POA: Insufficient documentation

## 2023-04-16 DIAGNOSIS — Z01818 Encounter for other preprocedural examination: Secondary | ICD-10-CM | POA: Diagnosis present

## 2023-04-16 HISTORY — DX: Pure hypercholesterolemia, unspecified: E78.00

## 2023-04-16 HISTORY — DX: Unspecified osteoarthritis, unspecified site: M19.90

## 2023-04-16 HISTORY — DX: Prediabetes: R73.03

## 2023-04-16 HISTORY — DX: Essential (primary) hypertension: I10

## 2023-04-16 HISTORY — DX: Anemia, unspecified: D64.9

## 2023-04-16 HISTORY — DX: Personal history of urinary calculi: Z87.442

## 2023-04-16 HISTORY — DX: Depression, unspecified: F32.A

## 2023-04-16 HISTORY — DX: Unspecified macular degeneration: H35.30

## 2023-04-16 LAB — SURGICAL PCR SCREEN
MRSA, PCR: NEGATIVE
Staphylococcus aureus: NEGATIVE

## 2023-04-16 NOTE — Progress Notes (Signed)
PCP - Dr. Sidonie Dickens Cardiologist - denies  PPM/ICD - denies   Chest x-ray - denies EKG - 03/2023 = requested tracing Stress Test - 06/21/21 ECHO - 06/21/21 Cardiac Cath -denies   Sleep Study - denies CPAP - n/a  Fasting Blood Sugar - patient does not check blood sugar at home   Blood Thinner Instructions: n/a Aspirin Instructions: n/a  ERAS Protcol - clears until 0430 PRE-SURGERY Ensure or G2- ensure  COVID TEST- n/a   Anesthesia review: yes - HTN;   Patient denies shortness of breath, fever, cough and chest pain at PAT appointment   All instructions explained to the patient, with a verbal understanding of the material. Patient agrees to go over the instructions while at home for a better understanding. Patient also instructed to self quarantine after being tested for COVID-19. The opportunity to ask questions was provided.

## 2023-04-17 ENCOUNTER — Encounter: Payer: Medicare Other | Admitting: Physical Therapy

## 2023-04-20 NOTE — Anesthesia Preprocedure Evaluation (Signed)
Anesthesia Evaluation  Patient identified by MRN, date of birth, ID band Patient awake    Reviewed: Allergy & Precautions, NPO status , Patient's Chart, lab work & pertinent test results  Airway Mallampati: III  TM Distance: >3 FB Neck ROM: Full  Mouth opening: Limited Mouth Opening  Dental  (+) Dental Advisory Given, Chipped,    Pulmonary neg pulmonary ROS   Pulmonary exam normal breath sounds clear to auscultation       Cardiovascular hypertension, Pt. on medications and Pt. on home beta blockers Normal cardiovascular exam Rhythm:Regular Rate:Normal     Neuro/Psych negative neurological ROS  negative psych ROS   GI/Hepatic negative GI ROS, Neg liver ROS,,,  Endo/Other  diabetes, Well Controlled, Type 2    Renal/GU negative Renal ROS  negative genitourinary   Musculoskeletal  (+) Arthritis ,    Abdominal   Peds  Hematology negative hematology ROS (+)   Anesthesia Other Findings   Reproductive/Obstetrics                             Anesthesia Physical Anesthesia Plan  ASA: 2  Anesthesia Plan: General   Post-op Pain Management: Tylenol PO (pre-op)*   Induction: Intravenous  PONV Risk Score and Plan: 3 and Dexamethasone and Ondansetron  Airway Management Planned: Oral ETT  Additional Equipment:   Intra-op Plan:   Post-operative Plan: Extubation in OR  Informed Consent: I have reviewed the patients History and Physical, chart, labs and discussed the procedure including the risks, benefits and alternatives for the proposed anesthesia with the patient or authorized representative who has indicated his/her understanding and acceptance.     Dental advisory given  Plan Discussed with: CRNA  Anesthesia Plan Comments: (2 IVs)       Anesthesia Quick Evaluation

## 2023-04-21 ENCOUNTER — Encounter (HOSPITAL_COMMUNITY)
Admission: RE | Disposition: A | Discharge status: Home / Self Care | Payer: Self-pay | Source: Home / Self Care | Attending: Orthopedic Surgery

## 2023-04-21 ENCOUNTER — Ambulatory Visit (HOSPITAL_COMMUNITY): Payer: Medicare Other | Admitting: Physician Assistant

## 2023-04-21 ENCOUNTER — Ambulatory Visit (HOSPITAL_BASED_OUTPATIENT_CLINIC_OR_DEPARTMENT_OTHER): Payer: Medicare Other | Admitting: Anesthesiology

## 2023-04-21 ENCOUNTER — Other Ambulatory Visit: Payer: Self-pay

## 2023-04-21 ENCOUNTER — Encounter (HOSPITAL_COMMUNITY): Payer: Self-pay | Admitting: Orthopedic Surgery

## 2023-04-21 ENCOUNTER — Ambulatory Visit (HOSPITAL_COMMUNITY)
Admission: RE | Admit: 2023-04-21 | Discharge: 2023-04-21 | Disposition: A | Discharge status: Home / Self Care | Payer: Medicare Other | Attending: Orthopedic Surgery | Admitting: Orthopedic Surgery

## 2023-04-21 ENCOUNTER — Ambulatory Visit (HOSPITAL_COMMUNITY): Payer: Medicare Other

## 2023-04-21 DIAGNOSIS — G96198 Other disorders of meninges, not elsewhere classified: Secondary | ICD-10-CM | POA: Insufficient documentation

## 2023-04-21 DIAGNOSIS — E119 Type 2 diabetes mellitus without complications: Secondary | ICD-10-CM | POA: Diagnosis not present

## 2023-04-21 DIAGNOSIS — I10 Essential (primary) hypertension: Secondary | ICD-10-CM | POA: Diagnosis not present

## 2023-04-21 DIAGNOSIS — M5116 Intervertebral disc disorders with radiculopathy, lumbar region: Secondary | ICD-10-CM | POA: Diagnosis present

## 2023-04-21 DIAGNOSIS — M5416 Radiculopathy, lumbar region: Secondary | ICD-10-CM | POA: Diagnosis not present

## 2023-04-21 DIAGNOSIS — M1990 Unspecified osteoarthritis, unspecified site: Secondary | ICD-10-CM | POA: Insufficient documentation

## 2023-04-21 DIAGNOSIS — Z01818 Encounter for other preprocedural examination: Secondary | ICD-10-CM

## 2023-04-21 DIAGNOSIS — M5126 Other intervertebral disc displacement, lumbar region: Secondary | ICD-10-CM

## 2023-04-21 HISTORY — PX: LUMBAR LAMINECTOMY: SHX95

## 2023-04-21 SURGERY — MICRODISCECTOMY LUMBAR LAMINECTOMY
Anesthesia: General | Site: Spine Lumbar

## 2023-04-21 MED ORDER — THROMBIN 20000 UNITS EX SOLR
CUTANEOUS | Status: AC
  Filled 2023-04-21: qty 20000

## 2023-04-21 MED ORDER — ROCURONIUM BROMIDE 10 MG/ML (PF) SYRINGE
PREFILLED_SYRINGE | INTRAVENOUS | Status: AC
  Filled 2023-04-21: qty 10

## 2023-04-21 MED ORDER — CHLORHEXIDINE GLUCONATE 0.12 % MT SOLN
15.0000 mL | Freq: Once | OROMUCOSAL | Status: AC
  Administered 2023-04-21: 15 mL via OROMUCOSAL
  Filled 2023-04-21: qty 15

## 2023-04-21 MED ORDER — HYDROMORPHONE HCL 1 MG/ML IJ SOLN
INTRAMUSCULAR | Status: DC | PRN
  Administered 2023-04-21: .5 mg via INTRAVENOUS

## 2023-04-21 MED ORDER — TIZANIDINE HCL 2 MG PO TABS: 2.0000 mg | ORAL_TABLET | Freq: Three times a day (TID) | ORAL | 0 refills | Status: AC | PRN

## 2023-04-21 MED ORDER — ORAL CARE MOUTH RINSE: 15.0000 mL | Freq: Once | OROMUCOSAL | Status: AC

## 2023-04-21 MED ORDER — ALBUMIN HUMAN 5 % IV SOLN: INTRAVENOUS | Status: DC | PRN

## 2023-04-21 MED ORDER — TRANEXAMIC ACID-NACL 1000-0.7 MG/100ML-% IV SOLN
1000.0000 mg | INTRAVENOUS | Status: AC
  Administered 2023-04-21: 1000 mg via INTRAVENOUS
  Filled 2023-04-21: qty 100

## 2023-04-21 MED ORDER — POLYETHYLENE GLYCOL 3350 17 G PO PACK: 17.0000 g | PACK | Freq: Every day | ORAL | 0 refills | Status: AC

## 2023-04-21 MED ORDER — ONDANSETRON HCL 4 MG/2ML IJ SOLN
INTRAMUSCULAR | Status: AC
  Filled 2023-04-21: qty 2

## 2023-04-21 MED ORDER — SENNA 8.6 MG PO TABS: 1.0000 | ORAL_TABLET | Freq: Two times a day (BID) | ORAL | 0 refills | Status: AC

## 2023-04-21 MED ORDER — SUGAMMADEX SODIUM 200 MG/2ML IV SOLN
INTRAVENOUS | Status: DC | PRN
  Administered 2023-04-21: 400 mg via INTRAVENOUS

## 2023-04-21 MED ORDER — ACETAMINOPHEN 500 MG PO TABS
1000.0000 mg | ORAL_TABLET | Freq: Once | ORAL | Status: AC
  Administered 2023-04-21: 1000 mg via ORAL
  Filled 2023-04-21: qty 2

## 2023-04-21 MED ORDER — ONDANSETRON HCL 4 MG/2ML IJ SOLN
INTRAMUSCULAR | Status: DC | PRN
  Administered 2023-04-21: 4 mg via INTRAVENOUS

## 2023-04-21 MED ORDER — PROPOFOL 10 MG/ML IV BOLUS
INTRAVENOUS | Status: AC
  Filled 2023-04-21: qty 20

## 2023-04-21 MED ORDER — VANCOMYCIN HCL 1000 MG IV SOLR
INTRAVENOUS | Status: DC | PRN
Start: 2023-04-21 — End: 2023-04-21
  Administered 2023-04-21: 1000 mg via TOPICAL

## 2023-04-21 MED ORDER — DEXAMETHASONE SODIUM PHOSPHATE 10 MG/ML IJ SOLN
INTRAMUSCULAR | Status: AC
  Filled 2023-04-21: qty 1

## 2023-04-21 MED ORDER — FENTANYL CITRATE (PF) 250 MCG/5ML IJ SOLN
INTRAMUSCULAR | Status: AC
  Filled 2023-04-21: qty 5

## 2023-04-21 MED ORDER — LABETALOL HCL 5 MG/ML IV SOLN
INTRAVENOUS | Status: DC | PRN
  Administered 2023-04-21 (×3): 5 mg via INTRAVENOUS

## 2023-04-21 MED ORDER — 0.9 % SODIUM CHLORIDE (POUR BTL) OPTIME
TOPICAL | Status: DC | PRN
  Administered 2023-04-21: 1000 mL

## 2023-04-21 MED ORDER — LACTATED RINGERS IV SOLN: INTRAVENOUS | Status: DC

## 2023-04-21 MED ORDER — FENTANYL CITRATE (PF) 100 MCG/2ML IJ SOLN
INTRAMUSCULAR | Status: AC
  Filled 2023-04-21: qty 2

## 2023-04-21 MED ORDER — EPHEDRINE SULFATE-NACL 50-0.9 MG/10ML-% IV SOSY
PREFILLED_SYRINGE | INTRAVENOUS | Status: DC | PRN
  Administered 2023-04-21: 5 mg via INTRAVENOUS

## 2023-04-21 MED ORDER — PROPOFOL 10 MG/ML IV BOLUS
INTRAVENOUS | Status: DC | PRN
  Administered 2023-04-21: 60 mg via INTRAVENOUS

## 2023-04-21 MED ORDER — OXYCODONE HCL 5 MG PO TABS: 5.0000 mg | ORAL_TABLET | ORAL | 0 refills | Status: AC | PRN

## 2023-04-21 MED ORDER — ROCURONIUM BROMIDE 10 MG/ML (PF) SYRINGE
PREFILLED_SYRINGE | INTRAVENOUS | Status: DC | PRN
  Administered 2023-04-21: 20 mg via INTRAVENOUS
  Administered 2023-04-21: 100 mg via INTRAVENOUS

## 2023-04-21 MED ORDER — AMISULPRIDE (ANTIEMETIC) 5 MG/2ML IV SOLN
INTRAVENOUS | Status: AC
  Filled 2023-04-21: qty 4

## 2023-04-21 MED ORDER — PHENYLEPHRINE HCL-NACL 20-0.9 MG/250ML-% IV SOLN
INTRAVENOUS | Status: DC | PRN
  Administered 2023-04-21: 30 ug/min via INTRAVENOUS

## 2023-04-21 MED ORDER — ACETAMINOPHEN 500 MG PO TABS: 1000.0000 mg | ORAL_TABLET | Freq: Three times a day (TID) | ORAL | 0 refills | Status: AC

## 2023-04-21 MED ORDER — POVIDONE-IODINE 10 % EX SWAB
2.0000 | Freq: Once | CUTANEOUS | Status: AC
  Administered 2023-04-21: 2 via TOPICAL

## 2023-04-21 MED ORDER — DEXAMETHASONE SODIUM PHOSPHATE 10 MG/ML IJ SOLN
10.0000 mg | Freq: Once | INTRAMUSCULAR | Status: AC
  Administered 2023-04-21: 10 mg via INTRAVENOUS
  Filled 2023-04-21: qty 1

## 2023-04-21 MED ORDER — LIDOCAINE 2% (20 MG/ML) 5 ML SYRINGE
INTRAMUSCULAR | Status: DC | PRN
  Administered 2023-04-21: 80 mg via INTRAVENOUS

## 2023-04-21 MED ORDER — FENTANYL CITRATE (PF) 100 MCG/2ML IJ SOLN
25.0000 ug | INTRAMUSCULAR | Status: DC | PRN
  Administered 2023-04-21: 25 ug via INTRAVENOUS

## 2023-04-21 MED ORDER — BUPIVACAINE-EPINEPHRINE (PF) 0.25% -1:200000 IJ SOLN
INTRAMUSCULAR | Status: AC
  Filled 2023-04-21: qty 30

## 2023-04-21 MED ORDER — THROMBIN 20000 UNITS EX SOLR
CUTANEOUS | Status: DC | PRN
  Administered 2023-04-21: 20 mL via TOPICAL

## 2023-04-21 MED ORDER — BUPIVACAINE-EPINEPHRINE 0.25% -1:200000 IJ SOLN
INTRAMUSCULAR | Status: DC | PRN
  Administered 2023-04-21: 30 mL

## 2023-04-21 MED ORDER — HYDROMORPHONE HCL 1 MG/ML IJ SOLN
INTRAMUSCULAR | Status: AC
  Filled 2023-04-21: qty 0.5

## 2023-04-21 MED ORDER — VANCOMYCIN HCL 1000 MG IV SOLR
INTRAVENOUS | Status: AC
  Filled 2023-04-21: qty 20

## 2023-04-21 MED ORDER — FENTANYL CITRATE (PF) 250 MCG/5ML IJ SOLN
INTRAMUSCULAR | Status: DC | PRN
  Administered 2023-04-21 (×5): 50 ug via INTRAVENOUS

## 2023-04-21 MED ORDER — LIDOCAINE 2% (20 MG/ML) 5 ML SYRINGE
INTRAMUSCULAR | Status: AC
  Filled 2023-04-21: qty 5

## 2023-04-21 MED ORDER — AMISULPRIDE (ANTIEMETIC) 5 MG/2ML IV SOLN
10.0000 mg | Freq: Once | INTRAVENOUS | Status: AC
  Administered 2023-04-21: 10 mg via INTRAVENOUS

## 2023-04-21 MED ORDER — PHENYLEPHRINE 80 MCG/ML (10ML) SYRINGE FOR IV PUSH (FOR BLOOD PRESSURE SUPPORT)
PREFILLED_SYRINGE | INTRAVENOUS | Status: DC | PRN
  Administered 2023-04-21: 160 ug via INTRAVENOUS

## 2023-04-21 MED ORDER — CEFAZOLIN SODIUM-DEXTROSE 2-4 GM/100ML-% IV SOLN
2.0000 g | INTRAVENOUS | Status: AC
  Administered 2023-04-21: 2 g via INTRAVENOUS
  Filled 2023-04-21: qty 100

## 2023-04-21 SURGICAL SUPPLY — 56 items
ALCOHOL 70% 16 OZ (MISCELLANEOUS) IMPLANT
BENZOIN TINCTURE PRP APPL 2/3 (GAUZE/BANDAGES/DRESSINGS) ×1 IMPLANT
BUR NEURO DRILL SOFT 3.0X3.8M (BURR) ×1 IMPLANT
CANISTER SUCT 3000ML PPV (MISCELLANEOUS) ×1 IMPLANT
CLSR STERI-STRIP ANTIMIC 1/2X4 (GAUZE/BANDAGES/DRESSINGS) ×1 IMPLANT
CNTNR URN SCR LID CUP LEK RST (MISCELLANEOUS) IMPLANT
CORD BIPOLAR FORCEPS 12FT (ELECTRODE) ×1 IMPLANT
COVER MAYO STAND STRL (DRAPES) ×1 IMPLANT
COVER SURGICAL LIGHT HANDLE (MISCELLANEOUS) ×1 IMPLANT
DERMABOND ADVANCED .7 DNX12 (GAUZE/BANDAGES/DRESSINGS) IMPLANT
DRAPE C-ARM 42X72 X-RAY (DRAPES) ×1 IMPLANT
DRAPE MICROSCOPE LEICA 54X105 (DRAPES) ×1 IMPLANT
DRAPE SURG 17X23 STRL (DRAPES) ×4 IMPLANT
DRAPE UTILITY XL STRL (DRAPES) ×1 IMPLANT
DRESSING MEPILEX FLEX 4X4 (GAUZE/BANDAGES/DRESSINGS) ×1 IMPLANT
DRSG MEPILEX FLEX 4X4 (GAUZE/BANDAGES/DRESSINGS)
DRSG MEPILEX POST OP 4X8 (GAUZE/BANDAGES/DRESSINGS) ×1 IMPLANT
DRSG TEGADERM 4X10 (GAUZE/BANDAGES/DRESSINGS) ×1 IMPLANT
DRSG TEGADERM 4X4.75 (GAUZE/BANDAGES/DRESSINGS) ×4 IMPLANT
DURAPREP 26ML APPLICATOR (WOUND CARE) ×1 IMPLANT
DURASEAL SPINE SEALANT 3ML (MISCELLANEOUS) IMPLANT
ELECT BLADE 4.0 EZ CLEAN MEGAD (MISCELLANEOUS) ×1
ELECT BLADE INSULATED 4IN (ELECTROSURGICAL) ×1
ELECT PENCIL ROCKER SW 15FT (MISCELLANEOUS) ×1 IMPLANT
ELECT REM PT RETURN 9FT ADLT (ELECTROSURGICAL) ×1
ELECTRODE BLADE INSULATED 4IN (ELECTROSURGICAL) ×1 IMPLANT
ELECTRODE BLDE 4.0 EZ CLN MEGD (MISCELLANEOUS) ×1 IMPLANT
ELECTRODE REM PT RTRN 9FT ADLT (ELECTROSURGICAL) ×1 IMPLANT
GLOVE BIO SURGEON STRL SZ7.5 (GLOVE) ×1 IMPLANT
GLOVE INDICATOR 7.5 STRL GRN (GLOVE) ×1 IMPLANT
GOWN STRL REUS W/ TWL LRG LVL3 (GOWN DISPOSABLE) ×1 IMPLANT
GOWN STRL SURGICAL XL XLNG (GOWN DISPOSABLE) ×1 IMPLANT
GRAFT DURAGEN MATRIX 1WX1L (Tissue) IMPLANT
KIT BASIN OR (CUSTOM PROCEDURE TRAY) ×1 IMPLANT
KIT POSITION SURG JACKSON T1 (MISCELLANEOUS) ×1 IMPLANT
KIT TURNOVER KIT B (KITS) ×1 IMPLANT
MANIFOLD NEPTUNE II (INSTRUMENTS) IMPLANT
NDL 22X1.5 STRL (OR ONLY) (MISCELLANEOUS) ×1 IMPLANT
NEEDLE 22X1.5 STRL (OR ONLY) (MISCELLANEOUS)
NS IRRIG 1000ML POUR BTL (IV SOLUTION) ×1 IMPLANT
PACK LAMINECTOMY ORTHO (CUSTOM PROCEDURE TRAY) ×1 IMPLANT
PATTIES SURGICAL .5 X.5 (GAUZE/BANDAGES/DRESSINGS) ×1 IMPLANT
SPONGE SURGIFOAM ABS GEL 100 (HEMOSTASIS) ×1 IMPLANT
SPONGE T-LAP 4X18 ~~LOC~~+RFID (SPONGE) ×1 IMPLANT
SUCTION TUBE FRAZIER 10FR DISP (SUCTIONS) ×1 IMPLANT
SUT BONE WAX W31G (SUTURE) ×1 IMPLANT
SUT MNCRL AB 3-0 PS2 18 (SUTURE) ×1 IMPLANT
SUT VIC AB 0 CT1 18XCR BRD8 (SUTURE) ×1 IMPLANT
SUT VIC AB 2-0 CT2 18 VCP726D (SUTURE) ×1 IMPLANT
SYR BULB IRRIG 60ML STRL (SYRINGE) ×1 IMPLANT
SYR CONTROL 10ML LL (SYRINGE) ×1 IMPLANT
TOWEL GREEN STERILE (TOWEL DISPOSABLE) ×1 IMPLANT
TOWEL GREEN STERILE FF (TOWEL DISPOSABLE) ×1 IMPLANT
TRAY FOLEY MTR SLVR 14FR STAT (SET/KITS/TRAYS/PACK) IMPLANT
TUBING FEATHERFLOW (TUBING) ×1 IMPLANT
WATER STERILE IRR 1000ML POUR (IV SOLUTION) ×1 IMPLANT

## 2023-04-21 NOTE — Anesthesia Procedure Notes (Signed)
Procedure Name: Intubation Date/Time: 04/21/2023 7:47 AM  Performed by: Orlin Hilding, CRNAPre-anesthesia Checklist: Patient identified, Emergency Drugs available, Suction available, Patient being monitored and Timeout performed Patient Re-evaluated:Patient Re-evaluated prior to induction Oxygen Delivery Method: Circle system utilized Preoxygenation: Pre-oxygenation with 100% oxygen Induction Type: IV induction Ventilation: Mask ventilation without difficulty and Oral airway inserted - appropriate to patient size Laryngoscope Size: Mac and 4 Grade View: Grade III Tube type: Oral Tube size: 7.0 mm Number of attempts: 1 Placement Confirmation: ETT inserted through vocal cords under direct vision, positive ETCO2 and breath sounds checked- equal and bilateral Secured at: 21 cm Tube secured with: Tape Dental Injury: Teeth and Oropharynx as per pre-operative assessment

## 2023-04-21 NOTE — Progress Notes (Signed)
Orthopedic Surgery Post-operative Progress Note  Assessment: Patient is a 82 y.o. female L3/4 microdiscectomy   Plan: -Operative plans complete -Drain: none -Out of bed as tolerated, no brace -No bending/lifting/twisting greater than 10 pounds -Pain control -Regular diet -No chemoprophylaxis for dvt or antiplatelets for 72 hours after surgery -Discharge to home  ___________________________________________________________________________   Subjective: No acute events since surgery. Pain well controlled. Not having any headaches. Did not have any headaches when seated at edge of bed or walking the halls.   Objective:  General: no acute distress Neurologic: drowsy, answering questions appropriately, following commands Respiratory: unlabored breathing on room air Skin: dressing clear/dry/intact  MSK (spine):  -Strength exam      Right  Left  EHL    5/5  5/5 TA    5/5  5/5 GSC    5/5  5/5 Knee extension  5/5  5/5 Hip flexion   5/5  5/5  -Sensory exam    Sensation intact to light touch in L3-S1 nerve distributions of bilateral lower extremities   Patient name: Wendy Vaughn Patient MRN: [id number] Date: 04/21/23

## 2023-04-21 NOTE — Transfer of Care (Signed)
Immediate Anesthesia Transfer of Care Note  Patient: Charmon Simonian  Procedure(s) Performed: LUMBAR THREE-FOUR MICRODISCECTOMY (Spine Lumbar)  Patient Location: PACU  Anesthesia Type:General  Level of Consciousness: drowsy and patient cooperative  Airway & Oxygen Therapy: Patient Spontanous Breathing and Patient connected to face mask oxygen  Post-op Assessment: Report given to RN and Post -op Vital signs reviewed and stable  Post vital signs: Reviewed and stable  Last Vitals:  Vitals Value Taken Time  BP 148/72 04/21/23 1130  Temp 36.7 C 04/21/23 1128  Pulse 88 04/21/23 1132  Resp 24 04/21/23 1132  SpO2 89 % 04/21/23 1132  Vitals shown include unfiled device data.  Last Pain:  Vitals:   04/21/23 0618  TempSrc: Oral  PainSc:       Patients Stated Pain Goal: 4 (04/21/23 0610)  Complications: No notable events documented.  O2 saturation was 93% when I left patients bedside

## 2023-04-21 NOTE — Discharge Instructions (Addendum)
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 Otherwise, there are no specific restrictions. You do not need to wear a brace. You are encouraged to walk as much as desired. You can perform household activities such as cleaning dishes, doing laundry, vacuuming, etc. You should let pain be your guide and gradually return to full activities.   Incision Care: Your incision site has a dressing over it. That dressing should remain in place and dry at all times for a total of one week after surgery. After one week, you can remove the dressing. Underneath the dressing, you will find skin glue. You should leave this skin glue in place. It will fall off with time. Do not pick, rub, or scrub at it. Do not put cream or lotion over the surgical area. After one week and once the dressing is off, it is okay to let soap and water run over your incision. Again, do not pick, scrub, or rub at the skin glue when bathing. Do not submerge (e.g., take a bath, swim, go in a hot tub, etc.) until six weeks after surgery. There may be some bloody drainage from the incision into the dressing after surgery. This is normal. You do not need to replace the dressing. Continue to leave it in place for the one week as instructed above. Should the dressing become saturated with blood or drainage, please call the office for further instructions.   Medications: You have been prescribed oxycodone. This is a narcotic pain medication and should only be taken as prescribed. You should not drink alcohol or operate heavy machinery (including driving) while taking this  medication. The oxycodone can cause constipation as a side effect. For that reason, you have been prescribed senna and miralax. These are both laxatives. You do not need to take this medication if you develop diarrhea. Should you remain constipated even while taking these medications, please increase the dose of miralax to twice daily. Tylenol has been prescribed to be taken every 8 hours, which will give you additional pain relief. Robaxin is a muscle relaxer that has been prescribed to you for muscle spasm type pain. Take this medication as needed.   You can use over-the-counter NSAIDs (ibuprofen, Aleve, Celebrex, naproxen, meloxicam, etc.) for additional pain relief after this surgery starting 72 hours after surgery. These medications are safe to take with the Tylenol you have been prescribed. You should not take these medications if you have or have had kidney problems or gastrointestinal ulcers or if you are currently on a blood thinner. Take these medications as instructed on the packaging.   In order to set expectations for opioid prescriptions, you will only be prescribed opioids for a total of six weeks after surgery and, at two-weeks after surgery, your opioid prescription will start to tapered (decreased dosage and number of pills). If you have ongoing need for opioid medication six weeks after surgery, you will be referred to pain management. If you are already established with a provider that is giving you opioid medications, you should schedule an appointment with them for six weeks after surgery if you feel you are going to need another prescription. State law only allows for opioid prescriptions one week at  a time. If you are running out of opioid medication near the end of the week, please call the office during business hours before running out so I can send you another prescription.   You may resume any home blood thinners (warfarin, lovenox, apixaban, plavix, xarelto, etc) 72 hours after  your surgery. Take these medications as they were previously prescribed.  Driving: You should not drive while taking narcotic pain medications. You should start getting back to driving slowly and you may want to try driving in a parking lot before doing anything more.   Diet: You are safe to resume your regular diet after surgery.   Reasons to Call the Office After Surgery: You should feel free to call the office with any concerns or questions you have in the post-operative period, but you should definitely notify the office if you develop: -shortness of breath, chest pain, or trouble breathing -headaches that won't go away (especially if they are only present when standing and go away when laying flat - if you develop these, lay completely flat in bed for the next 24 hours and please call Dr. Christell Constant who will guide you through the process)  -excessive bleeding, drainage, redness, or swelling around the surgical site -fevers, chills, or pain that is getting worse with each passing day -persistent nausea or vomiting -new weakness in either of your legs, new and worsening numbness/tingling in either leg -numbness in the groin, new bowel or bladder incontinence -other concerns about your surgery  Follow Up Appointments: You have an office appointment with Dr. Christell Constant on 05/01/2023 at 8:45am. Please arrive on time. Dr. Christell Constant will look at your incision and check on your symptoms at that visit.   Office Information:  -Willia Craze, MD -Phone number: [phone] -Address: [address]       [place], [place] [address]

## 2023-04-21 NOTE — Anesthesia Postprocedure Evaluation (Signed)
Anesthesia Post Note  Patient: Wendy Vaughn  Procedure(s) Performed: LUMBAR THREE-FOUR MICRODISCECTOMY (Spine Lumbar)     Patient location during evaluation: PACU Anesthesia Type: General Level of consciousness: awake and alert Pain management: pain level controlled Vital Signs Assessment: post-procedure vital signs reviewed and stable Respiratory status: spontaneous breathing, nonlabored ventilation, respiratory function stable and patient connected to nasal cannula oxygen Cardiovascular status: blood pressure returned to baseline and stable Postop Assessment: no apparent nausea or vomiting Anesthetic complications: no  No notable events documented.  Last Vitals:  Vitals:   04/21/23 1315 04/21/23 1330  BP: 132/68 135/70  Pulse: 90 91  Resp: 16 18  Temp:    SpO2: 94% 93%    Last Pain:  Vitals:   04/21/23 1130  TempSrc:   PainSc: Asleep                 Senya Hinzman L Sirron Francesconi

## 2023-04-21 NOTE — Op Note (Addendum)
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 female who presented to the office with signs and symptoms of radiating right leg pain. Work up revealed a L3/4 disc herniation in the area of the L4 nerve. The patient had tried conservative treatments that did not provide any lasting relief. As result, operative management was discussed. The pre-operative MRI showed a L3/4 right-sided disc herniation so L3/4 hemilaminotomy and microdiscectomy was presented as a treatment option. The risks of the surgery including but not limited to recurrent disc herniation, persistent pain, dural tear, nerve root injury, spinal cord injury, infection, bleeding, fracture, instability, need for additional procedures, blindness, heart attack, stroke, and death were discussed with the patient. The benefits of the surgery would be faster relief of the patient's radiating right leg pain. Explained that this symptomatic relief is for leg pain and the surgery will not necessarily help any back pain. The alternatives to surgical management were covered with the patient and included activity modification, physical therapy, over-the-counter pain medications, and injections.  All the patient's questions were answered to her satisfaction. After this discussion,  the patient expressed understanding and elected to proceed with surgical intervention.  Procedure Description: The patient was met in the pre-operative holding area. The patient's identity and consent were verified. The operative site was marked. The patient's remaining questions about the surgery were answered. The patient was brought back to the operating room. General anesthesia was induced and an endotracheal tube was placed by the anesthesia staff. The patient was transferred to the prone [place] table in the prone position. All bony prominences were well padded. The head of the bed was slightly elevated and the eyes were free from compression by the face pillow. The surgical area was cleansed with alcohol. Fluoroscopy was then brought in to check rotation on the AP image and to mark the levels on the lateral image. The patient's skin was then prepped and draped in a standard, sterile fashion. A time out was performed that identified the patient, the procedure, and the operative level. All team members agreed with what was stated in the time out.   A midline incision over the spinous processes of the previously marked levels was made and sharp dissection was continued down through the skin and dermis. Electrocautery was then used to continue the midline dissection down to the level of the spinous process. Subperiosteal dissection was performed using electrocautery to expose the lamina out lateral to the facet joint capsule on the side of the disc herniation. Care was taken to not violate the facet joint capsule. A lateral fluoroscopic image was taken to confirm the level. Subperiosteal dissection with electrocautery was then done to expose the lamina and pars of L3 and L4. The operative microscope was brought in at this time for visualization and to improve lighting.   A high-speed matchstick burr was used to thin the hemilamina to the level of the ligamentum flavum. The lamina was thinned  with the burr  to the edge of the ligamentum flavum insertion. Care was taken to leave at least 8mm of pars interarticularis. A curved curette was used to develop a plane between the ligamentum and the lamina. A series of Kerrison rongeurs were used to remove the thinned lamina to complete the laminotomy. A curved curette was used to elevate the ligamentum flavum off of the thecal sac. A combination of Kerrison rongeurs and a pituitary were used to remove the ligamentum flavum overlying the thecal sac and nerve root in the area of the laminotomy. On the lateral aspect of the dura on the right side near the L4 pedicle, an arachnoid bleb was seen. There was no CSF seen leaking from the arachnoid bleb.   A penfield was used to mobilize the nerve root. A nerve root retractor was placed into the laminotomy site and around the traversing nerve root to mobilize it medially. The disc herniation was visualized. A long-handle knife was used to create an annulotomy. A pituitary was used to remove loose disc fragments. There were several fragments that were successfully removed. Discectomy was performed to the level of the L4 pedicle where there was a fragment seen on the pre-operative MRI. A nerve hook was placed into the annulotomy to attempt to free any other loose fragments. There were no other loose fragments. A woodsen was used to palpate the disc space and there was no remaining herniation. At this point, the disc space was flush with the posterior vertebral bodies. A lateral fluoroscopic image was taken with two penfields to demonstrate the extent of the discectomy from cranial to caudal and that that appropriate level was done.   The wound was copiously irrigated with sterile saline. At the end of the case, the arachnoid bleb was probed with the penfield. No CSF was seen leaking from it. It was along the lateral aspect of the dura. A duragen patch was placed over the bleb and tucked underneath the remaining lamina and facet  joint. A thin, misted layer of DuraSeal Exact was placed over the dura. 500mg  of vancomycin powder was placed into the wound. The fascia was reapproximated with 0 vicryl suture. The subcutaneous fat was reapproximated with 0 vicryl suture. The deep dermal layer was reapproximated with 2-0 vicryl. The skin as closed with a 3-0 running monocryl. All counts were correct at the end of the case. Dermabond was used over the skin. An island dressing was placed over the wound. The patient was transferred back to a bed and brought to the post-anesthesia care unit by anesthesia staff in stable condition.   Post-operative plan: The patient will recover in the post-anesthesia care unit with a tentative plan to go home. She will be seated in an upright position and will walk the halls to see if she develops any positional headaches, prior to discharging her to home. The patient will be out of bed as tolerated with no brace. The patient will be seen in the office approximately 10-14 days from the date of surgery.    Willia Craze, MD Orthopedic Surgeon

## 2023-04-21 NOTE — Brief Op Note (Signed)
04/21/2023  11:33 AM  PATIENT:  Wendy Vaughn  82 y.o. female  PRE-OPERATIVE DIAGNOSIS:  LUMBAR RADICULOPATHY  POST-OPERATIVE DIAGNOSIS:  LUMBAR RADICULOPATHY  PROCEDURE:  Procedure(s): LUMBAR THREE-FOUR MICRODISCECTOMY (N/A)  SURGEON:  Surgeons and Role:    London Sheer, MD - Primary  PHYSICIAN ASSISTANT: none  ASSISTANTS: none   ANESTHESIA:   general  EBL:  30 mL   BLOOD ADMINISTERED:none  DRAINS: none   LOCAL MEDICATIONS USED:  MARCAINE     SPECIMEN:  No Specimen  DISPOSITION OF SPECIMEN:  N/A  COUNTS:  YES  TOURNIQUET:  NONE  DICTATION: .Note written in EPIC  PLAN OF CARE:  Anticipate discharge to home  PATIENT DISPOSITION:  PACU - hemodynamically stable.   Delay start of Pharmacological VTE agent (>24hrs) due to surgical blood loss or risk of bleeding: yes

## 2023-04-21 NOTE — H&P (Signed)
Orthopedic Spine Surgery H&P Note  Assessment: Patient is a 82 y.o. female with lumbar radiculopathy due to disc herniation at L3/4   Plan: -Out of bed as tolerated, activity as tolerated, no brace -Covered the risks of surgery one more time with the patient and patient elected to proceed with planned surgery -Written consent verified -Hold anticoagulation in anticipation of surgery -Ancef and TXA on all to OR -NPO for procedure -Site marked -To OR when ready  The risks of the surgery were again covered this morning. Those risks included but were not limited to: recurrent disc herniation, persistent pain, dural tear, nerve root injury, infection, fracture, instability, need for additional procedures, and death. I explained that this surgery is for radiating leg pain and is not predictable at providing any relief of back pain.  ___________________________________________________________________________  Chief Complaint: right leg pain  History: Patient is 82 y.o. female who has been previously seen in the office for right leg pain. Work up revealed a right sided disc herniation at L3/4 that was causing radiculopathy. Her symptoms failed to improve with conservative treatment so operative management was discussed at the last office visit. The patient presents today with no changes in her symptoms since the last office visit. See previous office note for further details.    Review of systems: General: denies fevers and chills, myalgias Neurologic: denies recent changes in vision, slurred speech Abdomen: denies nausea, vomiting, hematemesis Respiratory: denies cough, shortness of breath  Past medical history: HLD HTN Chronic pain Prediabetes (last A1c was 6.4 on 04/01/2023)   Allergies: NKDA   Past surgical history:  Left knee arthroscopy   Social history: Denies use of nicotine product (smoking, vaping, patches, smokeless) Alcohol use: Denies Denies recreational drug  use   Family history: -reviewed and not pertinent to lumbar disc herniation   Physical Exam:  BMI of 31.2  General: no acute distress, appears stated age Neurologic: alert, answering questions appropriately, following commands Cardiovascular: regular rate, no cyanosis Respiratory: unlabored breathing on room air, symmetric chest rise Psychiatric: appropriate affect, normal cadence to speech   MSK (spine):  -Strength exam      Left  Right  EHL    5/5  5/5 TA    5/5  5/5 GSC    5/5  5/5 Knee extension  5/5  5/5 Knee flexion   5/5  5/5 Hip flexion   5/5  5/5  -Sensory exam    Sensation intact to light touch in L3-S1 nerve distributions of bilateral lower extremities   Patient name: Wendy Vaughn Patient MRN: [id number] Date: 04/21/23

## 2023-04-21 NOTE — Discharge Summary (Signed)
Orthopedic Surgery Discharge Summary  Patient name: Wendy Vaughn Patient MRN: [id number] Surgery date: 04/21/2023 Discharge date: 04/21/2023  Attending physician: Willia Craze, MD Final diagnosis: L3/4 disc herniation, lumbar radiculopathy Findings: L3/4 disc herniation with several loose fragments, arachnoid bleb on the lateral wall of the dura near the L4 pedicle on the right side  Hospital course: Patient is a 82 y.o. female who under went L3/4 microdiscectomy on 04/21/2023. The patient had significant pain immediately after surgery, but pain eventually was controlled with a multimodal regimen including oxycodone.The patient was able to sit upright for 10 minutes without any headaches. She was able to ambulate the PACU halls and did not develop any headaches.The patient was tolerating PO without issue. The patient's vitals were stable on discharge. The patient was medically ready for discharge and was discharge to home on the same day of surgery.   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 Otherwise, there are no specific restrictions. You do not need to wear a brace. You are encouraged to walk as much as desired. You can perform household activities such as cleaning dishes, doing laundry, vacuuming, etc. You should let pain be your guide and gradually return to full activities.   Incision Care: Your incision site has a dressing over it. That dressing should remain in place and dry at all times for a total of one week after surgery. After one week, you can remove the dressing. Underneath the  dressing, you will find skin glue. You should leave this skin glue in place. It will fall off with time. Do not pick, rub, or scrub at it. Do not put cream or lotion over the surgical area. After one week and once the dressing is off, it is okay to let soap and water run over your incision. Again, do not pick, scrub, or rub at the skin glue when bathing. Do not submerge (e.g., take a bath, swim, go in a hot tub, etc.) until six weeks after surgery. There may be some bloody drainage from the incision into the dressing after surgery. This is normal. You do not need to replace the dressing. Continue to leave it in place for the one week as instructed above. Should the dressing become saturated with blood or drainage, please call the office for further instructions.   Medications: You have been prescribed oxycodone. This is a narcotic pain medication and should only be taken as prescribed. You should not drink alcohol or operate heavy machinery (including driving) while taking this medication. The oxycodone can cause constipation as a side effect. For that reason, you have been prescribed senna and miralax. These are both laxatives. You do not need to take this medication if you develop diarrhea. Should you remain constipated even while taking these medications, please increase the dose of miralax to twice daily. Tylenol has been prescribed to be taken every 8 hours, which will give you additional pain relief. Robaxin is a muscle relaxer that has been prescribed to you for muscle spasm type pain. Take this medication as needed.   You can use over-the-counter NSAIDs (ibuprofen, Aleve, Celebrex, naproxen, meloxicam, etc.) for additional pain relief after this surgery starting 72 hours after surgery. These  medications are safe to take with the Tylenol you have been prescribed. You should not take these medications if you have or have had kidney problems or gastrointestinal ulcers or if you are currently on a blood  thinner. Take these medications as instructed on the packaging.   In order to set expectations for opioid prescriptions, you will only be prescribed opioids for a total of six weeks after surgery and, at two-weeks after surgery, your opioid prescription will start to tapered (decreased dosage and number of pills). If you have ongoing need for opioid medication six weeks after surgery, you will be referred to pain management. If you are already established with a provider that is giving you opioid medications, you should schedule an appointment with them for six weeks after surgery if you feel you are going to need another prescription. State law only allows for opioid prescriptions one week at a time. If you are running out of opioid medication near the end of the week, please call the office during business hours before running out so I can send you another prescription.   You may resume any home blood thinners (warfarin, lovenox, apixaban, plavix, xarelto, etc) 72 hours after your surgery. Take these medications as they were previously prescribed.  Driving: You should not drive while taking narcotic pain medications. You should start getting back to driving slowly and you may want to try driving in a parking lot before doing anything more.   Diet: You are safe to resume your regular diet after surgery.   Reasons to Call the Office After Surgery: You should feel free to call the office with any concerns or questions you have in the post-operative period, but you should definitely notify the office if you develop: -shortness of breath, chest pain, or trouble breathing -headaches that won't go away (especially if they are only present when standing and go away when laying flat - if you develop these, lay completely flat in bed for the next 24 hours and please call Dr. Christell Constant who will guide you through the process)  -excessive bleeding, drainage, redness, or swelling around the surgical site -fevers,  chills, or pain that is getting worse with each passing day -persistent nausea or vomiting -new weakness in either of your legs, new and worsening numbness/tingling in either leg -numbness in the groin, new bowel or bladder incontinence -other concerns about your surgery  Follow Up Appointments: You have an office appointment with Dr. Christell Constant on 05/01/2023 at 8:45am. Please arrive on time. Dr. Christell Constant will look at your incision and check on your symptoms at that visit.   Office Information:  -Willia Craze, MD -Phone number: [phone] -Address: [address]       [place], [place] [address]

## 2023-04-22 ENCOUNTER — Encounter (HOSPITAL_COMMUNITY): Payer: Self-pay | Admitting: Orthopedic Surgery

## 2023-04-24 ENCOUNTER — Encounter: Payer: Medicare Other | Admitting: Physical Therapy

## 2023-04-28 ENCOUNTER — Encounter: Payer: Medicare Other | Admitting: Physical Therapy

## 2023-04-29 ENCOUNTER — Telehealth: Payer: Self-pay

## 2023-04-29 NOTE — Telephone Encounter (Signed)
Patient left message on triage line stating the bandage she took off was "saturated" she changed the bandage, states its a "watery" drainage, she just wants to see if she needs to come in before Thursday? [phone]

## 2023-04-30 ENCOUNTER — Ambulatory Visit (INDEPENDENT_AMBULATORY_CARE_PROVIDER_SITE_OTHER): Payer: Medicare Other | Admitting: Orthopedic Surgery

## 2023-04-30 ENCOUNTER — Telehealth: Payer: Self-pay | Admitting: Orthopedic Surgery

## 2023-04-30 DIAGNOSIS — Z9889 Other specified postprocedural states: Secondary | ICD-10-CM

## 2023-04-30 MED ORDER — DOXYCYCLINE HYCLATE 100 MG PO TABS: 100.0000 mg | ORAL_TABLET | Freq: Two times a day (BID) | ORAL | 0 refills | Status: AC

## 2023-04-30 NOTE — Telephone Encounter (Signed)
I tried to call pt, rings 1 time and says "your call cannot be completed at this time, try your call again later". I called the daughter and lmovm for her to have Juel to call me back so I can get her in today.  If she calls back please call me

## 2023-04-30 NOTE — Telephone Encounter (Signed)
Per pt yes she can come in at 10:45 am today per Dr Christell Constant request

## 2023-04-30 NOTE — Progress Notes (Signed)
Orthopedic Surgery Post-operative Office Visit  Procedure: L3/4 right sided microdiscectomy Date of Surgery: 04/21/2023 (~9 days post-op)  Assessment: Patient is a 82 y.o. who has had drainage since surgery but is otherwise doing well.  Her leg pain has gotten significantly better since surgery   Plan: -Operative plans complete -Out of bed as tolerated, no brace -No bending/lifting/twisting greater than 10 pounds -I explained that she has had drainage longer than I typically expect after her surgery.  I prescribed her doxycycline.  I told her that this could be a sign of an infection.  If it persists, then I would recommend irrigation and debridement with specimen collection for culture. -Pain management: OTC medications -Return to office in 7-10 days, lumbar x-rays needed at next visit: None  ___________________________________________________________________________   Subjective: Patient has been at home since surgery.  She has been doing well since the surgery.  Her back pain from the incision has been gradually getting better with time.  Her leg pain has gotten significantly better.  She has had drainage from her wound though.  She has had to put a dressing over her back to help with the drainage.  She describes it as a light yellow color.  No purulent material has been seen.  No redness has been seen around the incision.  Patient has not had any headaches since surgery.   Objective:  General: no acute distress, appropriate affect Neurologic: alert, answering questions appropriately, following commands Respiratory: unlabored breathing on room air Skin: Her dressing was removed and serous drainage was seen on the dressing, a small amount of serous drainage was expressible from the wound but no active drainage was occurring, there is no erythema around the incision or induration  MSK (spine):  -Strength exam      Left  Right  EHL    5/5  5/5 TA    5/5  5/5 GSC     5/5  5/5 Knee extension  5/5  5/5 Hip flexion   5/5  5/5  -Sensory exam    Sensation intact to light touch in L3-S1 nerve distributions of bilateral lower extremities  Imaging: None obtained today  Patient name: Wendy Vaughn Patient MRN: [id number] Date of visit: 04/30/23

## 2023-05-01 ENCOUNTER — Encounter: Payer: Medicare Other | Admitting: Orthopedic Surgery

## 2023-05-09 ENCOUNTER — Ambulatory Visit (INDEPENDENT_AMBULATORY_CARE_PROVIDER_SITE_OTHER): Payer: Medicare Other | Admitting: Orthopedic Surgery

## 2023-05-09 DIAGNOSIS — Z9889 Other specified postprocedural states: Secondary | ICD-10-CM

## 2023-05-09 NOTE — Progress Notes (Signed)
Orthopedic Surgery Post-operative Office Visit   Procedure: L3/4 right sided microdiscectomy Date of Surgery: 04/21/2023 (~2.5 weeks post-op)   Assessment: Patient is a 82 y.o. whose leg pain has improved since surgery. Drainage has stopped within the last couple of days     Plan: -Operative plans complete -Out of bed as tolerated, no brace -No bending/lifting/twisting greater than 10 pounds -Finish her course of antibiotics -Okay to let soap/water run over the incision but do not submerge -Pain management: OTC medications -Return to office in 1 week, lumbar x-rays needed at next visit: none   ___________________________________________________________________________     Subjective: Patient still notes significant improvement in her radiating leg pain since surgery. She has not had any drainage from her incision for the last 3 days. She has not noticed any redness around her incision.      Objective:   General: no acute distress, appropriate affect Neurologic: alert, answering questions appropriately, following commands Respiratory: unlabored breathing on room air Skin: No erythema around the incision, no active or expressible drainage, no drainage seen on the dressing that she came in with   MSK (spine):   -Strength exam                                                   Left                  Right   EHL                              5/5                  5/5 TA                                 5/5                  5/5 GSC                             5/5                  5/5 Knee extension            5/5                  5/5 Hip flexion                    5/5                  5/5   -Sensory exam                           Sensation intact to light touch in L3-S1 nerve distributions of bilateral lower extremities   Imaging: None obtained today   Patient name: Wendy Vaughn Patient MRN: [id number] Date of visit: 05/09/23

## 2023-05-19 ENCOUNTER — Other Ambulatory Visit (INDEPENDENT_AMBULATORY_CARE_PROVIDER_SITE_OTHER): Payer: Medicare Other

## 2023-05-19 ENCOUNTER — Ambulatory Visit: Payer: Medicare Other | Admitting: Orthopedic Surgery

## 2023-05-19 DIAGNOSIS — M25562 Pain in left knee: Secondary | ICD-10-CM

## 2023-05-19 MED ORDER — FLUCONAZOLE 150 MG PO TABS: 150.0000 mg | ORAL_TABLET | Freq: Every day | ORAL | 0 refills | Status: AC

## 2023-05-19 NOTE — Progress Notes (Signed)
 Orthopedic Surgery Post-operative Office Visit   Procedure: L3/4 right sided microdiscectomy Date of Surgery: 04/21/2023 (~2.5 weeks post-op)   Assessment: Patient is a 83 y.o. who is doing well after surgery     Plan: -Operative plans complete -Out of bed as tolerated, no brace -No bending/lifting/twisting greater than 10 pounds -Patient feels she got a yeast infection after the abx, so diflucan  prescribed today -Okay to submerge wound at this point -Pain management: OTC medications -Return to office in 2 weeks, lumbar x-rays needed at next visit: none  Left knee injection note: After discussing the risks, benefits, and alternatives of left knee injection, patient elected to proceed. The patient was in the seated position with the left knee at 90 degrees. The anterolateral soft spot was prepped with an alcohol based prep. The skin was anesthestized with ethyl chloride. A 20 gauge needle was used to inject 1cc of bupivacaine , 1cc of lidocaine , 1cc of depomedrol under standard sterile technique. Needle was withdrawn and band aid applied. Patient tolerated the procedure well.     ___________________________________________________________________________     Subjective: Patient has not had any return of her radiating right leg pain since surgery. She has not noticed any drainage or redness around her incision. She has not seen any drainage on her pants or underwear.   She also wanted to talk about her left knee pain. She has a history of meniscectomy on that knee. She has had pain in it for awhile but it has gotten worse recently. She says it will sometimes lock up on her as well.      Objective:   General: no acute distress, appropriate affect Neurologic: alert, answering questions appropriately, following commands Respiratory: unlabored breathing on room air Skin: Incision appears well healed with no erythema, induration, or active/expressible drainage    MSK (spine):   -Strength  exam                                                   Left                  Right   EHL                              5/5                  5/5 TA                                 5/5                  5/5 GSC                             5/5                  5/5 Knee extension            5/5                  5/5 Hip flexion                    5/5  5/5   -Sensory exam                           Sensation intact to light touch in L3-S1 nerve distributions of bilateral lower extremities  Left knee exam: TTP over th medial joint line otherwise nontender to palpation over the remainder of the knee, no effusion, EHL/TA/GSC intact, ROM from 0-100, pain with mcmurray but no palpable click, knee stable to varus and valgus stress, negative lachman, negative posterior drawer   Imaging: XRs of the left knee from 05/19/2023 were independently reviewed and interpreted, showing tricompartmental degenerative changes that are most notable in the lateral and patellofemoral compartments. Osteophyte formation and joint space narrowing seen. No fracture or dislocation seen.     Patient name: Wendy Vaughn Patient MRN: [id number] Date of visit: 05/19/23

## 2023-06-02 ENCOUNTER — Ambulatory Visit (INDEPENDENT_AMBULATORY_CARE_PROVIDER_SITE_OTHER): Payer: Medicare Other | Admitting: Orthopedic Surgery

## 2023-06-02 DIAGNOSIS — Z9889 Other specified postprocedural states: Secondary | ICD-10-CM

## 2023-06-02 MED ORDER — FLUCONAZOLE 150 MG PO TABS: 150.0000 mg | ORAL_TABLET | Freq: Every day | ORAL | 0 refills | Status: AC

## 2023-06-02 NOTE — Progress Notes (Signed)
Orthopedic Surgery Post-operative Office Visit   Procedure: L3/4 right sided microdiscectomy Date of Surgery: 04/21/2023 (~6 weeks post-op)   Assessment: Patient is a 83 y.o. who is doing well after surgery     Plan: -Operative plans complete -No spine specific restrictions -Pain management: OTC medications -For her back pain, recommended: lidocaine patches, brace when more active, tens unit, and tramadol as needed when she is more active -Return to office in 4 weeks, lumbar x-rays needed at next visit: AP/lateral/flex/ex lumbar   ___________________________________________________________________________     Subjective: Patient has been doing well since she was last seen.  She is not having any radiating leg pain.  She still has back pain.  She feels it in the thoracic spine particularly on the left side in the area of the paraspinal muscles.  She notes this pain when she is more active.  She said she recently went to a [employer] basketball game and had pain with that.  She does not have pain when she is less active though.     Objective:   General: no acute distress, appropriate affect Neurologic: alert, answering questions appropriately, following commands Respiratory: unlabored breathing on room air Skin: lumbar incision is well healed   MSK (spine):   -Strength exam                                                   Left                  Right   EHL                              5/5                  5/5 TA                                 5/5                  5/5 GSC                             5/5                  5/5 Knee extension            5/5                  5/5 Hip flexion                    5/5                  5/5   -Sensory exam                           Sensation intact to light touch in L3-S1 nerve distributions of bilateral lower extremities   Imaging: None obtained at today's visit     Patient name: Wendy Vaughn Patient MRN: [id number] Date of visit:  06/02/23

## 2023-06-30 ENCOUNTER — Other Ambulatory Visit (INDEPENDENT_AMBULATORY_CARE_PROVIDER_SITE_OTHER): Payer: Self-pay

## 2023-06-30 ENCOUNTER — Ambulatory Visit (INDEPENDENT_AMBULATORY_CARE_PROVIDER_SITE_OTHER): Payer: Medicare Other | Admitting: Orthopedic Surgery

## 2023-06-30 DIAGNOSIS — Z9889 Other specified postprocedural states: Secondary | ICD-10-CM

## 2023-06-30 DIAGNOSIS — M546 Pain in thoracic spine: Secondary | ICD-10-CM | POA: Diagnosis not present

## 2023-06-30 NOTE — Progress Notes (Signed)
Orthopedic Surgery Post-operative Office Visit   Procedure: L3/4 right sided microdiscectomy Date of Surgery: 04/21/2023 (~3 months post-op)   Assessment: Patient is a 83 y.o. who is doing well after surgery     Plan: -Operative plans complete -No spine specific restrictions -Pain management: OTC medications -Her leg pain has improved since surgery.  She is not currently having any back pain and she has not been as active.  For that reason, I told her to continue use lidocaine patches, heat, and a TENS unit as needed -Could consider facet injections in the future if we can pinpoint where she is having the pain -Return to office in 3 months, lumbar x-rays needed at next visit: none   ___________________________________________________________________________     Subjective: Patient is been doing well since she was last seen.  She is not having any radiating leg pain.  She has not had much back pain.  She has been to a few basketball games but has not been more active than that.  She has not tried doing any yard work.  She says usually the yard work aggravates it.  She has not noticed any redness or drainage around her incision.     Objective:   General: no acute distress, appropriate affect Neurologic: alert, answering questions appropriately, following commands Respiratory: unlabored breathing on room air Skin: lumbar incision is well healed   MSK (spine):   -Strength exam                                                   Left                  Right   EHL                              5/5                  5/5 TA                                 5/5                  5/5 GSC                             5/5                  5/5 Knee extension            5/5                  5/5 Hip flexion                    5/5                  5/5   -Sensory exam                           Sensation intact to light touch in L3-S1 nerve distributions of bilateral lower extremities    Imaging: XRs of the lumbar spine from 06/30/2023 were independently reviewed and interpreted, showing disc height loss at L3/4  and L5/S1. No evidence of instability on flexion/extension views. No fracture or dislocation seen.      Patient name: Wendy Vaughn Patient MRN: [id number] Date of visit: 06/30/23

## 2023-08-20 ENCOUNTER — Telehealth: Payer: Self-pay | Admitting: Radiology

## 2023-08-20 ENCOUNTER — Ambulatory Visit (INDEPENDENT_AMBULATORY_CARE_PROVIDER_SITE_OTHER): Admitting: Orthopedic Surgery

## 2023-08-20 DIAGNOSIS — G8929 Other chronic pain: Secondary | ICD-10-CM | POA: Diagnosis not present

## 2023-08-20 DIAGNOSIS — M546 Pain in thoracic spine: Secondary | ICD-10-CM

## 2023-08-20 MED ORDER — OXYCODONE HCL 5 MG PO TABS: 5.0000 mg | ORAL_TABLET | Freq: Four times a day (QID) | ORAL | 0 refills | Status: AC | PRN

## 2023-08-20 MED ORDER — METHYLPREDNISOLONE 4 MG PO TBPK: ORAL_TABLET | ORAL | 0 refills | Status: AC

## 2023-08-20 NOTE — Telephone Encounter (Signed)
 Patient was seen today and states that she rec'd a bill for $150 from [facility], states that her insurance did not cover her xrays were not covered due to being miscoded. Can you please look into this for her and see if it can be rebilled?

## 2023-08-20 NOTE — Progress Notes (Signed)
 Orthopedic Surgery Post-operative Office Visit   Procedure: L3/4 right sided microdiscectomy Date of Surgery: 04/21/2023 (~4 months post-op)   Assessment: Patient is a 83 y.o. whose leg pain has gotten better since surgery. Is experiencing thoracic back pain     Plan: -Operative plans complete -Prescribed oxycodone and medrol dosepak for her trip to [place] to help with the pain -Can continue to use lidocaine, heat, and tens unit -Patient already has follow up with me after her trip to [place], I will plan to see her then. X-rays at next visit: none   ___________________________________________________________________________     Subjective: Patient has not had any return of her radiating leg pain. She has noticed thoracic back pain. She feels it around the T8-10 level and mostly on the left side. She has been doing some yardwork of late that may have aggravated it. No other recent injury.      Objective:   General: no acute distress, appropriate affect Neurologic: alert, answering questions appropriately, following commands Respiratory: unlabored breathing on room air Skin: lumbar incision is well healed   MSK (spine):   -Strength exam                                                   Left                  Right   EHL                              5/5                  5/5 TA                                 5/5                  5/5 GSC                             5/5                  5/5 Knee extension            5/5                  5/5 Hip flexion                    5/5                  5/5   -Sensory exam                           Sensation intact to light touch in L3-S1 nerve distributions of bilateral lower extremities   Has some TTP over the left side of her thoracic spine around T8-10  Imaging: None obtained today     Patient name: Wendy Vaughn Patient MRN: [id number] Date of visit: 08/20/23

## 2023-09-29 ENCOUNTER — Ambulatory Visit: Payer: Medicare Other | Admitting: Orthopedic Surgery

## 2023-09-29 DIAGNOSIS — M1712 Unilateral primary osteoarthritis, left knee: Secondary | ICD-10-CM | POA: Diagnosis not present

## 2023-09-29 NOTE — Progress Notes (Signed)
 Orthopedic Surgery Post-operative Office Visit   Procedure: L3/4 right sided microdiscectomy Date of Surgery: 04/21/2023 (~5 months post-op)   Assessment: Patient is a 83 y.o. whose leg pain has gotten better since surgery. Has had recent worsening of her thoracic back pain since cleaning out part of her house for a friend to live in. Also having left knee pain consistent with OA     Plan: -Operative plans complete -Can use oxycodone  sparingly as she has been. Recommended tylenol  up to 1000mg  TID for every day pain. Can use Aleve in addition to the tylenol  for worse days -For her left knee pain, she has previously responded to injections. She requested another one today which was done in the office. See procedure note below.  -Patient should follow up in 3 months, x-rays at next visit: AP/lateral/flex/ex lumbar  Left knee injection note: After discussing the risks, benefits, and alternatives of left knee intraarticular injection, patient elected to proceed. The patient was in the seated position with the knee at 90 degrees. The anterolateral soft spot was prepped with an alcohol based prep. Ethyl chloride was used to anesthestize the area. A 20 gauge needle was used to inject 1cc of depomedrol, 1cc of lidocaine , and 1cc of bupivacaine  into the intraarticular space under standard sterile technique . Needle was withdrawn and bandaid was applied. Patient tolerated the procedure well.    ___________________________________________________________________________     Subjective: Patient is not having any radiating leg pain. She was able to go on her trip to [place]. She used her walker and did not have to miss out on anything. She did not use any oxycodone  on that trip. She then was cleaning out part of her house to move a friend in temporarily. This aggravated her left sided thoracic back pain. She feels it in the area of the lower thoracic paraspinal muscles. No radiating leg pain. The thoracic  back pain is in the same area as before it is just more notable. She also is reporting left knee pain. She has had this before and has responded to injections that generally give her about 4-5 months relief. She was interested in a repeat injection for her left knee pain today.      Objective:   General: no acute distress, appropriate affect Neurologic: alert, answering questions appropriately, following commands Respiratory: unlabored breathing on room air Skin: lumbar incision is well healed   MSK (spine):   -Strength exam                                                   Left                  Right   EHL                              5/5                  5/5 TA                                 5/5                  5/5 GSC  5/5                  5/5 Knee extension            5/5                  5/5 Hip flexion                    5/5                  5/5   -Sensory exam                           Sensation intact to light touch in L3-S1 nerve distributions of bilateral lower extremities     Imaging: None obtained today     Patient name: Wendy Vaughn Patient MRN: [id number] Date of visit: 09/29/23

## 2023-12-29 ENCOUNTER — Ambulatory Visit (INDEPENDENT_AMBULATORY_CARE_PROVIDER_SITE_OTHER): Admitting: Orthopedic Surgery

## 2023-12-29 ENCOUNTER — Other Ambulatory Visit (INDEPENDENT_AMBULATORY_CARE_PROVIDER_SITE_OTHER): Payer: Self-pay

## 2023-12-29 DIAGNOSIS — Z9889 Other specified postprocedural states: Secondary | ICD-10-CM | POA: Diagnosis not present

## 2023-12-29 DIAGNOSIS — M533 Sacrococcygeal disorders, not elsewhere classified: Secondary | ICD-10-CM | POA: Diagnosis not present

## 2023-12-29 DIAGNOSIS — M1712 Unilateral primary osteoarthritis, left knee: Secondary | ICD-10-CM | POA: Diagnosis not present

## 2023-12-29 MED ORDER — OXYCODONE HCL 5 MG PO TABS: 5.0000 mg | ORAL_TABLET | ORAL | 0 refills | Status: AC | PRN

## 2023-12-29 NOTE — Progress Notes (Signed)
 Orthopedic Surgery Post-operative Office Visit   Procedure: L3/4 right sided microdiscectomy Date of Surgery: 04/21/2023 (~8 months post-op)   Assessment: Patient is a 83 y.o. whose leg pain has gotten better since surgery.  Is having acute onset of right posterior hip pain     Plan: -Operative plans complete -I went over her x-rays and her exam findings.  I told her at this point, my differential diagnosis consists of SI joint pain, right hip arthritis, L5 radiculopathy.  With her pain being mechanical in nature and only present with weightbearing, I think radiculopathy is less likely.  Based on where her pain is and her exam, I feel that SI joint pain is more likely so I recommended a diagnostic/therapeutic at the SI joint.  If that does not give her relief, would consider a intra-articular hip injection as a next step for further workup -Patient is having left knee pain again and was interested in a repeat injection in office which was done today.  See procedure note below -Patient should follow up in 4 weeks (9/18 at 3:45pm), x-rays at next visit: none   Left knee injection note: After discussing the risks, benefits, and alternatives of left knee intraarticular injection, patient elected to proceed. The patient was in the seated position with the knee at 90 degrees. The anterolateral soft spot was prepped with an alcohol based prep. Ethyl chloride was used to anesthestize the area. A 20 gauge needle was used to inject 1cc of depomedrol, 1cc of lidocaine , and 1cc of bupivacaine  into the intraarticular space under standard sterile technique . Needle was withdrawn and bandaid was applied. Patient tolerated the procedure well.    ___________________________________________________________________________     Subjective: Patient is still not having any radiating leg pain like prior to surgery.  She has noticed for the last month or 2 pain in the right posterior hip.  She notes it is present when  she is walking or standing.  She does not have this pain when laying down or sitting down.  She also notes it is present when she goes from sitting to standing.  She does not have any left-sided symptoms.  There is no trauma or injury that preceded the onset of this pain.     Objective:   General: no acute distress, appropriate affect Neurologic: alert, answering questions appropriately, following commands Respiratory: unlabored breathing on room air Skin: lumbar incision is well healed   MSK (spine):   -Strength exam                                                   Left                  Right   EHL                              5/5                  5/5 TA                                 5/5                  5/5 GSC  5/5                  5/5 Knee extension            5/5                  5/5 Hip flexion                    5/5                  5/5   -Sensory exam                           Sensation intact to light touch in L3-S1 nerve distributions of bilateral lower extremities     Right hip exam: Negative Stinchfield, negative FADIR, positive FABER, positive Fortin finger test, negative Gaenslen's, negative logroll  Imaging: XRs of the lumbar spine from 12/29/2023 were independently reviewed and interpreted, showing significant degenerative changes within the right hip.  There is osteophyte formation, subchondral sclerosis, and joint space narrowing.  In the lumbar spine, there is disc height loss with anterior osteophyte formation at multiple levels.  The least affected appears to be L4/5.  No fracture or dislocation seen.  No evidence of instability on flexion/extension views.     Patient name: Wendy Vaughn Patient MRN: [id number] Date of visit: 12/29/23

## 2024-01-08 ENCOUNTER — Ambulatory Visit: Admitting: Sports Medicine

## 2024-01-08 ENCOUNTER — Encounter: Payer: Self-pay | Admitting: Sports Medicine

## 2024-01-08 ENCOUNTER — Other Ambulatory Visit: Payer: Self-pay

## 2024-01-08 DIAGNOSIS — M533 Sacrococcygeal disorders, not elsewhere classified: Secondary | ICD-10-CM

## 2024-01-08 DIAGNOSIS — M7918 Myalgia, other site: Secondary | ICD-10-CM | POA: Diagnosis not present

## 2024-01-08 DIAGNOSIS — Z9889 Other specified postprocedural states: Secondary | ICD-10-CM | POA: Diagnosis not present

## 2024-01-08 DIAGNOSIS — M5441 Lumbago with sciatica, right side: Secondary | ICD-10-CM

## 2024-01-08 DIAGNOSIS — G8929 Other chronic pain: Secondary | ICD-10-CM

## 2024-01-08 NOTE — Progress Notes (Signed)
 Office & Procedure Note  Patient: Wendy Vaughn             Date of Birth: Dec 09, 1940           MRN: [id number]             Visit Date: 01/08/2024  HPI: Wendy Vaughn is a pleasant 83 year old female who presents with chronic right sided back/buttock and leg pain.  She is status post L3-4 right sided microdiscectomy on 04/21/2023 by Dr. Georgina.  The last 2 months she has been having right sided posterior hip and buttock pain.  She has some residual radicular symptoms from her surgery, but this is less of a concern.  Just here recently her right side has felt somewhat better as she had to rest from her previous eye surgery.  She is not using any prescription based medications for her acute pain.  She is a type-II diabetic, but well-controlled. She is not on any diabetic medication currently.  - Last A1c 08/07/23: 6.7  PE:  - Back/SI joint: + TTP over the right SI joint region in the mid buttock.  Positive Fortin's point test over the right SI joint just inferior to the PSIS.  Positive SI joint compression test and FABER testing.   Imaging:  - 12/29/23: XRs of the lumbar spine from 12/29/2023 were independently reviewed and  interpreted, showing significant degenerative changes within the right  hip.  There is osteophyte formation, subchondral sclerosis, and joint  space narrowing.  In the lumbar spine, there is disc height loss with  anterior osteophyte formation at multiple levels.  The least affected  appears to be L4/5.  No fracture or dislocation seen.  No evidence of  instability on flexion/extension views.   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 Chronic bilateral low back pain with right-sided sciatica   3. S/P lumbar microdiscectomy   4. Right buttock pain    Procedures:  U/S-guided SI-joint injection, Right   After discussion of risk/benefits/indications, informed verbal consent was obtained. A timeout was then performed. The patient was positioned in a  prone position on exam room table with a pillow placed under the pelvis for mild hip flexion. The SI joint area was cleaned and prepped with betadine  and alcohol swabs . Sterile ultrasound gel was applied and the ultrasound transducer was placed in an anatomic axial plane over the PSIS, then moved distally over the SI-joint. Using ultrasound guidance, a 22-gauge, 3.5 needle was inserted from a medial to lateral approach utilizing an in-plane approach and directed into the SI-joint. The SI-joint was then injected with a mixture of 4:2 lidocaine :depomedrol with visualization of the injectate flow into the SI-joint under ultrasound visualization. The patient tolerated the procedure well without immediate complications.  Assessment/Plan: - Patient has been having chronic right posterior hip/buttock pain, her examination and symptomatology is most indicative of SI joint dysfunction.  Does have some degree of hip arthritis although his exam is less indicative of this today.   - For both diagnostic and therapeutic purposes, we did proceed with ultrasound-guided right SI joint injection, patient tolerated well. - Discussed postinjection protocol, she may use ice/heat and or Tylenol  for any postinjection pain.  Fine for muscle relaxers as needed as well. - Discussed the importance of paying attention over the next few days to weeks to what degree of relief she receives in which location, she will follow-up with Dr. Georgina here over the next 2-3 weeks for repeat evaluation to  help guide further treatment.  She will keep this follow-up with Dr. Georgina, I am happy to see her back as needed.  Lonell Sprang, DO Primary Care Sports Medicine Physician  [facility]  This note was dictated using Dragon naturally speaking software and may contain errors in syntax, spelling, or content which have not been identified prior to signing this note.

## 2024-02-02 ENCOUNTER — Ambulatory Visit: Admitting: Orthopedic Surgery

## 2024-02-12 ENCOUNTER — Ambulatory Visit: Admitting: Orthopedic Surgery

## 2024-02-17 ENCOUNTER — Telehealth: Payer: Self-pay | Admitting: Orthopedic Surgery

## 2024-02-17 NOTE — Telephone Encounter (Signed)
 Pt states she was told by the billing department to call and speak with Dr. Georgina or his assistant regarding a charge that she is being billed.

## 2024-02-18 ENCOUNTER — Telehealth: Payer: Self-pay | Admitting: Orthopedic Surgery

## 2024-02-18 NOTE — Telephone Encounter (Signed)
 Pt called stating that she is being billed for a visit and procedure that Medicare won;t pay Medicare is saying it wasn't necessary. Pt call back number is [phone]

## 2024-02-18 NOTE — Telephone Encounter (Signed)
 Tried to call, but no answer, will try again later

## 2024-02-18 NOTE — Telephone Encounter (Signed)
 I called and spoke with patient about the Bill--I forwarded that to [place] to look into.  She states that she had the SI joint injection and that she got 80% relief from that.

## 2024-02-27 NOTE — Telephone Encounter (Signed)
 I called patient and advised has been corrected/re billed to insurance. LMVM

## 2024-03-18 ENCOUNTER — Ambulatory Visit: Admitting: Orthopedic Surgery

## 2024-05-10 ENCOUNTER — Ambulatory Visit: Admitting: Orthopedic Surgery

## 2024-05-10 ENCOUNTER — Other Ambulatory Visit: Payer: Self-pay

## 2024-05-10 ENCOUNTER — Other Ambulatory Visit (INDEPENDENT_AMBULATORY_CARE_PROVIDER_SITE_OTHER): Payer: Self-pay

## 2024-05-10 DIAGNOSIS — Z9889 Other specified postprocedural states: Secondary | ICD-10-CM | POA: Diagnosis not present

## 2024-05-10 DIAGNOSIS — M79671 Pain in right foot: Secondary | ICD-10-CM

## 2024-05-10 MED ORDER — INDOMETHACIN 25 MG PO CAPS: 25.0000 mg | ORAL_CAPSULE | Freq: Three times a day (TID) | ORAL | 0 refills | Status: AC

## 2024-05-10 NOTE — Progress Notes (Signed)
 Orthopedic Surgery Progress Note   Assessment: Patient is a 83 y.o. female with 2 issues:  Right MTP joint pain consistent with gout flare Right hip pain consistent with osteoarthritis   Plan: -For her right hip pain, this is less significant at this point.  The indomethacin  that I am recommending for her gout will likely help with this as well.  Could consider an injection in the future -For her right foot pain, this appears consistent with gout.  Her MTP joint still has some swelling, erythema, and pain with range of motion.  Given the lack of of pain through mid or early range of motion, I do not think this is septic arthritis.  She has noticed improvement with colchicine.  I told her to keep taking the colchicine.  I prescribed indomethacin  to take as well.  She was interested in a intra-articular injection which was done today in the office.  See procedure note below -Patient should return to the office on an as needed basis  Right MTP joint aspiration/injection note: After discussing the risk, benefits, alternatives of right MTP joint of the great toe aspiration/injection, patient like to proceed.  Patient was in the seated position.  The skin over the dorsal aspect of the MTP joint was prepped with alcohol-based prep.  Ethyl chloride was used to anesthetize the skin.  Gentle traction was applied to the great toe as a 22-gauge needle was used to access the MTP joint understanding sterile technique.  An attempt was made at aspiration but no fluid was able to be withdrawn.  The needle was left in place and the syringe was removed.  A new syringe with 0.25 cc of lidocaine , 0.25 cc of bupivacaine , 0.5 cc of betamethasone was attached to the needle and the medication was injected into the joint.  Needle was then withdrawn and Band-Aid was applied.  Patient tolerated the procedure well.  ___________________________________________________________________________  Subjective: Patient comes in today  for 2 pains.  Her first 1 is her more significant which is pain in the forefoot on the right side.  There was no trauma or injury that preceded the onset of the pain.  She first noticed it about 2 weeks ago and then it got severe about a week ago.  She noticed the area was red and swollen.  She had difficulty with weightbearing.  She saw her primary care doctor who prescribed her colchicine.  She has a history of gout with symptoms on the left side in the past.  She said she has been eating increased shellfish lately.  She has not been drinking alcohol.  Pain has gotten better since starting the colchicine.  She is now able to weight-bear and sandals.  She is using a cane which she used before this episode.   Her other pain which is less significant is her right hip pain.  She feels it in the lateral aspect of the hip.  She notes it mostly with weightbearing.  She also has pain in the buttock on the right side.  No pain in the left side.   Physical Exam:  General: no acute distress, appears stated age Neurologic: alert, answering questions appropriately, following commands Respiratory: unlabored breathing on room air, symmetric chest rise Psychiatric: appropriate affect, normal cadence to speech  MSK:   Right lower extremity  Swelling seen at the MTP joint of the left foot.  Bunion deformity at the left foot.  Pain at extremes of end of range of motion in the MTP  joint.  No pain through middle or early range of motion.  No swelling, erythema, or pain through range of motion seen at the other MTP joints.  No pain through ankle range of motion.  Range of motion at the MTP joint and the great toe from 15 degrees of plantarflexion to 20 degrees of dorsiflexion  Positive Stinchfield, positive FADIR, negative FABER, negative SI joint compression test at the right hip Fires hip flexors, quadriceps, hamstrings, tibialis anterior, gastrocnemius and soleus, extensor hallucis longus Plantarflexes and  dorsiflexes toes Sensation intact to light touch in sural, saphenous, tibial, deep peroneal, and superficial peroneal nerve distributions Foot warm and well perfused    Patient name: Wendy Vaughn Patient MRN: [id number] Date: 05/10/2024
